# Patient Record
Sex: Female | Born: 1975 | Race: White | Hispanic: No | Marital: Married | Smoking: Never smoker
Health system: Southern US, Community
[De-identification: ages and names within clinical notes are randomized; demographics above are authoritative.]

## PROBLEM LIST (undated history)

## (undated) DIAGNOSIS — E069 Thyroiditis, unspecified: Secondary | ICD-10-CM

## (undated) DIAGNOSIS — Z9889 Other specified postprocedural states: Secondary | ICD-10-CM

## (undated) DIAGNOSIS — G43909 Migraine, unspecified, not intractable, without status migrainosus: Secondary | ICD-10-CM

## (undated) DIAGNOSIS — D649 Anemia, unspecified: Secondary | ICD-10-CM

## (undated) DIAGNOSIS — F419 Anxiety disorder, unspecified: Secondary | ICD-10-CM

## (undated) DIAGNOSIS — J45909 Unspecified asthma, uncomplicated: Secondary | ICD-10-CM

## (undated) DIAGNOSIS — R87619 Unspecified abnormal cytological findings in specimens from cervix uteri: Secondary | ICD-10-CM

## (undated) DIAGNOSIS — G47419 Narcolepsy without cataplexy: Secondary | ICD-10-CM

## (undated) DIAGNOSIS — B977 Papillomavirus as the cause of diseases classified elsewhere: Secondary | ICD-10-CM

## (undated) DIAGNOSIS — K589 Irritable bowel syndrome without diarrhea: Secondary | ICD-10-CM

## (undated) DIAGNOSIS — F319 Bipolar disorder, unspecified: Secondary | ICD-10-CM

## (undated) DIAGNOSIS — M1990 Unspecified osteoarthritis, unspecified site: Secondary | ICD-10-CM

## (undated) DIAGNOSIS — K219 Gastro-esophageal reflux disease without esophagitis: Secondary | ICD-10-CM

## (undated) HISTORY — DX: Gastro-esophageal reflux disease without esophagitis: K21.9

## (undated) HISTORY — DX: Papillomavirus as the cause of diseases classified elsewhere: B97.7

## (undated) HISTORY — DX: Anemia, unspecified: D64.9

## (undated) HISTORY — DX: Thyroiditis, unspecified: E06.9

## (undated) HISTORY — DX: Narcolepsy without cataplexy: G47.419

## (undated) HISTORY — DX: Irritable bowel syndrome, unspecified: K58.9

## (undated) HISTORY — DX: Unspecified asthma, uncomplicated: J45.909

## (undated) HISTORY — DX: Bipolar disorder, unspecified: F31.9

## (undated) HISTORY — DX: Unspecified abnormal cytological findings in specimens from cervix uteri: R87.619

## (undated) HISTORY — DX: Migraine, unspecified, not intractable, without status migrainosus: G43.909

## (undated) HISTORY — DX: Anxiety disorder, unspecified: F41.9

## (undated) HISTORY — PX: INGUINAL HERNIA REPAIR: SUR1180

## (undated) HISTORY — DX: Other specified postprocedural states: Z98.890

## (undated) HISTORY — PX: ABDOMINAL HYSTERECTOMY: SHX81

## (undated) HISTORY — PX: BUNIONECTOMY: SHX129

## (undated) HISTORY — DX: Unspecified osteoarthritis, unspecified site: M19.90

---

## 2004-05-14 ENCOUNTER — Other Ambulatory Visit (HOSPITAL_COMMUNITY): Admission: RE | Admit: 2004-05-14 | Discharge: 2004-05-18 | Payer: Self-pay | Admitting: Psychiatry

## 2004-05-20 ENCOUNTER — Emergency Department (HOSPITAL_COMMUNITY): Admission: EM | Admit: 2004-05-20 | Discharge: 2004-05-20 | Payer: Self-pay | Admitting: Emergency Medicine

## 2004-05-20 ENCOUNTER — Ambulatory Visit: Payer: Self-pay | Admitting: Psychiatry

## 2004-05-20 ENCOUNTER — Inpatient Hospital Stay (HOSPITAL_COMMUNITY): Admission: RE | Admit: 2004-05-20 | Discharge: 2004-05-24 | Payer: Self-pay | Admitting: Psychiatry

## 2004-05-25 ENCOUNTER — Other Ambulatory Visit (HOSPITAL_COMMUNITY): Admission: RE | Admit: 2004-05-25 | Discharge: 2004-08-23 | Payer: Self-pay | Admitting: Psychiatry

## 2004-09-08 ENCOUNTER — Inpatient Hospital Stay (HOSPITAL_COMMUNITY): Admission: AD | Admit: 2004-09-08 | Discharge: 2004-09-08 | Payer: Self-pay | Admitting: Obstetrics and Gynecology

## 2004-09-18 ENCOUNTER — Emergency Department (HOSPITAL_COMMUNITY): Admission: EM | Admit: 2004-09-18 | Discharge: 2004-09-19 | Payer: Self-pay | Admitting: Emergency Medicine

## 2004-10-26 ENCOUNTER — Other Ambulatory Visit: Admission: RE | Admit: 2004-10-26 | Discharge: 2004-10-26 | Payer: Self-pay | Admitting: Obstetrics and Gynecology

## 2005-02-02 ENCOUNTER — Inpatient Hospital Stay (HOSPITAL_COMMUNITY): Admission: AD | Admit: 2005-02-02 | Discharge: 2005-02-02 | Payer: Self-pay | Admitting: Gynecology

## 2005-03-31 ENCOUNTER — Encounter: Payer: Self-pay | Admitting: Family Medicine

## 2005-03-31 LAB — CONVERTED CEMR LAB

## 2005-04-19 ENCOUNTER — Inpatient Hospital Stay (HOSPITAL_COMMUNITY): Admission: AD | Admit: 2005-04-19 | Discharge: 2005-04-21 | Payer: Self-pay | Admitting: Obstetrics and Gynecology

## 2005-09-08 ENCOUNTER — Ambulatory Visit: Payer: Self-pay | Admitting: Endocrinology

## 2005-09-19 ENCOUNTER — Encounter (HOSPITAL_COMMUNITY): Admission: RE | Admit: 2005-09-19 | Discharge: 2005-11-22 | Payer: Self-pay | Admitting: Endocrinology

## 2005-10-19 ENCOUNTER — Ambulatory Visit: Payer: Self-pay | Admitting: Family Medicine

## 2005-11-01 ENCOUNTER — Ambulatory Visit: Payer: Self-pay | Admitting: Family Medicine

## 2005-11-18 ENCOUNTER — Ambulatory Visit: Payer: Self-pay | Admitting: Endocrinology

## 2006-01-03 ENCOUNTER — Ambulatory Visit: Payer: Self-pay | Admitting: Endocrinology

## 2006-01-31 ENCOUNTER — Ambulatory Visit: Payer: Self-pay | Admitting: Family Medicine

## 2006-02-01 ENCOUNTER — Ambulatory Visit: Payer: Self-pay | Admitting: Family Medicine

## 2006-03-01 ENCOUNTER — Ambulatory Visit: Payer: Self-pay | Admitting: Family Medicine

## 2006-03-01 DIAGNOSIS — D72819 Decreased white blood cell count, unspecified: Secondary | ICD-10-CM | POA: Insufficient documentation

## 2006-04-27 ENCOUNTER — Encounter: Payer: Self-pay | Admitting: Family Medicine

## 2006-04-27 DIAGNOSIS — E069 Thyroiditis, unspecified: Secondary | ICD-10-CM

## 2006-04-27 DIAGNOSIS — R87619 Unspecified abnormal cytological findings in specimens from cervix uteri: Secondary | ICD-10-CM

## 2006-04-27 DIAGNOSIS — F411 Generalized anxiety disorder: Secondary | ICD-10-CM | POA: Insufficient documentation

## 2006-04-27 DIAGNOSIS — K589 Irritable bowel syndrome without diarrhea: Secondary | ICD-10-CM

## 2006-04-27 DIAGNOSIS — F319 Bipolar disorder, unspecified: Secondary | ICD-10-CM

## 2006-04-27 DIAGNOSIS — B977 Papillomavirus as the cause of diseases classified elsewhere: Secondary | ICD-10-CM

## 2006-04-27 DIAGNOSIS — Z9189 Other specified personal risk factors, not elsewhere classified: Secondary | ICD-10-CM

## 2006-05-17 ENCOUNTER — Ambulatory Visit: Payer: Self-pay | Admitting: Endocrinology

## 2006-05-17 LAB — CONVERTED CEMR LAB
Basophils Relative: 0.4 % (ref 0.0–1.0)
Eosinophils Absolute: 0 10*3/uL (ref 0.0–0.6)
Hemoglobin: 13.5 g/dL (ref 12.0–15.0)
Lymphocytes Relative: 39 % (ref 12.0–46.0)
MCHC: 33.5 g/dL (ref 30.0–36.0)
MCV: 94.5 fL (ref 78.0–100.0)
Monocytes Relative: 4.6 % (ref 3.0–11.0)
Prolactin: 5.3 ng/mL
RBC: 4.27 M/uL (ref 3.87–5.11)
RDW: 12 % (ref 11.5–14.6)
TSH: 1.31 microintl units/mL (ref 0.35–5.50)

## 2006-05-29 ENCOUNTER — Ambulatory Visit: Payer: Self-pay | Admitting: Internal Medicine

## 2006-05-31 ENCOUNTER — Ambulatory Visit: Payer: Self-pay | Admitting: Family Medicine

## 2006-07-10 ENCOUNTER — Emergency Department (HOSPITAL_COMMUNITY): Admission: EM | Admit: 2006-07-10 | Discharge: 2006-07-10 | Payer: Self-pay | Admitting: *Deleted

## 2006-07-15 ENCOUNTER — Emergency Department (HOSPITAL_COMMUNITY): Admission: EM | Admit: 2006-07-15 | Discharge: 2006-07-15 | Payer: Self-pay | Admitting: Emergency Medicine

## 2006-07-20 ENCOUNTER — Telehealth: Payer: Self-pay | Admitting: Family Medicine

## 2006-08-16 ENCOUNTER — Ambulatory Visit: Payer: Self-pay | Admitting: Family Medicine

## 2006-08-16 DIAGNOSIS — S0990XA Unspecified injury of head, initial encounter: Secondary | ICD-10-CM | POA: Insufficient documentation

## 2006-12-01 ENCOUNTER — Ambulatory Visit: Payer: Self-pay | Admitting: Family Medicine

## 2006-12-01 DIAGNOSIS — L650 Telogen effluvium: Secondary | ICD-10-CM

## 2006-12-04 ENCOUNTER — Telehealth: Payer: Self-pay | Admitting: Family Medicine

## 2006-12-04 LAB — CONVERTED CEMR LAB: Iron: 70 ug/dL (ref 42–145)

## 2007-08-30 ENCOUNTER — Ambulatory Visit (HOSPITAL_COMMUNITY): Admission: RE | Admit: 2007-08-30 | Discharge: 2007-08-30 | Payer: Self-pay | Admitting: Obstetrics and Gynecology

## 2008-07-04 ENCOUNTER — Emergency Department (HOSPITAL_COMMUNITY): Admission: EM | Admit: 2008-07-04 | Discharge: 2008-07-04 | Payer: Self-pay | Admitting: Emergency Medicine

## 2008-10-25 ENCOUNTER — Emergency Department: Payer: Self-pay | Admitting: Emergency Medicine

## 2008-10-29 ENCOUNTER — Ambulatory Visit: Payer: Self-pay | Admitting: Family Medicine

## 2008-12-29 ENCOUNTER — Ambulatory Visit: Payer: Self-pay | Admitting: Family Medicine

## 2009-02-28 HISTORY — PX: BREAST ENHANCEMENT SURGERY: SHX7

## 2009-03-04 ENCOUNTER — Ambulatory Visit: Payer: Self-pay | Admitting: Family Medicine

## 2009-03-17 ENCOUNTER — Inpatient Hospital Stay: Payer: Self-pay | Admitting: Psychiatry

## 2009-04-20 ENCOUNTER — Ambulatory Visit: Payer: Self-pay | Admitting: Family Medicine

## 2009-04-20 DIAGNOSIS — R5381 Other malaise: Secondary | ICD-10-CM

## 2009-04-20 DIAGNOSIS — R5383 Other fatigue: Secondary | ICD-10-CM

## 2009-04-21 LAB — CONVERTED CEMR LAB
CO2: 31 meq/L (ref 19–32)
Calcium: 9.3 mg/dL (ref 8.4–10.5)
Chloride: 106 meq/L (ref 96–112)
GFR calc non Af Amer: 86.29 mL/min (ref >60)
Glucose, Bld: 87 mg/dL (ref 70–99)
Hemoglobin: 12.6 g/dL (ref 12.0–15.0)
Lymphocytes Relative: 25.3 % (ref 12.0–46.0)
Lymphs Abs: 1.7 10*3/uL (ref 0.7–4.0)
MCV: 98.4 fL (ref 78.0–100.0)
Monocytes Relative: 4.4 % (ref 3.0–12.0)
Neutro Abs: 4.6 10*3/uL (ref 1.4–7.7)
Potassium: 3.9 meq/L (ref 3.5–5.1)
RDW: 11.9 % (ref 11.5–14.6)
Sodium: 142 meq/L (ref 135–145)
TSH: 0.75 microintl units/mL (ref 0.35–5.50)

## 2009-06-10 ENCOUNTER — Encounter: Payer: Self-pay | Admitting: Family Medicine

## 2009-06-10 LAB — CONVERTED CEMR LAB
AST: 14 units/L
Alkaline Phosphatase: 49 units/L
Cholesterol: 157 mg/dL
HCT: 40.5 %
LDL Cholesterol: 93 mg/dL
RBC: 4.21 M/uL
Sodium: 139 meq/L
TSH: 1.05 microintl units/mL
Total Protein: 7.8 g/dL
Vit D, 25-Hydroxy: 29.3 ng/mL

## 2009-06-23 ENCOUNTER — Ambulatory Visit: Payer: Self-pay | Admitting: Family Medicine

## 2009-06-29 ENCOUNTER — Telehealth: Payer: Self-pay | Admitting: Family Medicine

## 2009-07-30 ENCOUNTER — Ambulatory Visit: Payer: Self-pay | Admitting: Family Medicine

## 2009-09-16 ENCOUNTER — Telehealth: Payer: Self-pay | Admitting: Family Medicine

## 2009-12-23 ENCOUNTER — Encounter: Payer: Self-pay | Admitting: Family Medicine

## 2009-12-23 LAB — CONVERTED CEMR LAB
AST: 14 units/L
Albumin: 4.4 g/dL
Alkaline Phosphatase: 49 units/L
BUN: 17 mg/dL
Chloride: 105 meq/L
Cholesterol: 168 mg/dL
Creatinine, Ser: 0.9 mg/dL
Glucose, Bld: 88 mg/dL
HDL: 60 mg/dL
LDL Cholesterol: 95 mg/dL
Potassium: 4.1 meq/L
RBC: 4.25 M/uL
TSH: 1.24 microintl units/mL
Triglycerides: 67 mg/dL
WBC: 6.68 10*3/uL

## 2010-01-11 ENCOUNTER — Ambulatory Visit: Payer: Self-pay | Admitting: Internal Medicine

## 2010-01-11 ENCOUNTER — Encounter: Payer: Self-pay | Admitting: Family Medicine

## 2010-01-11 IMAGING — RF DG HYSTEROGRAM
6 series · 6 of 6 positions shown · IV contrast (omnipaque)
Comparison: None

CLINICAL DATA: Post Essure procedure.  Evaluate tubes

HYSTEROSALPINGOGRAM
TECHNIQUE: Following cleansing of the cervix and vagina with
Betadine solution, a hysterosalpingogram was performed using a 5-
French hysterosalpingogram catheter and Omnipaque 300 contrast.

[Series 1: run · 1 of 1 slices shown (1 of 6)]
[im 1/1]
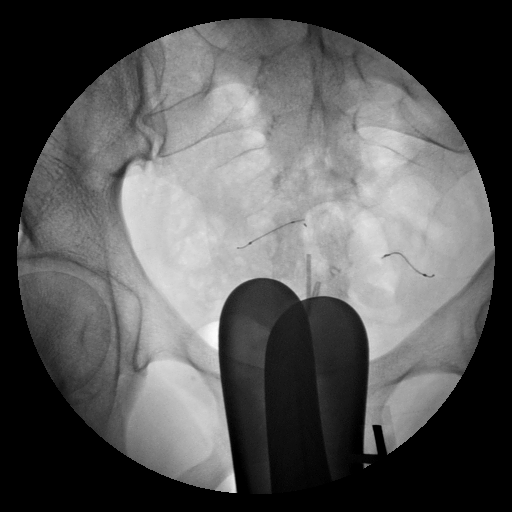

[Series 2: run · 1 of 1 slices shown (2 of 6)]
[im 1/1]
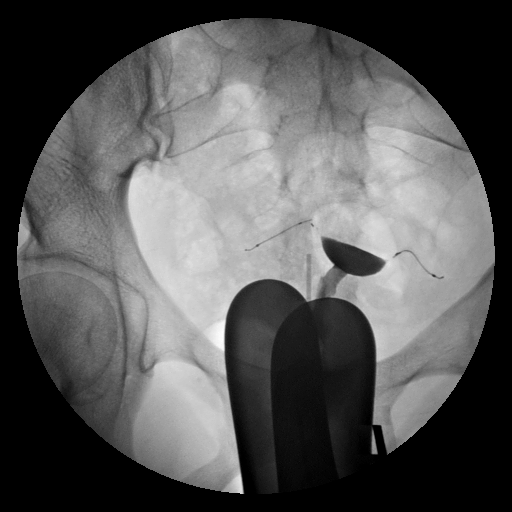

[Series 3: run · 1 of 1 slices shown (3 of 6)]
[im 1/1]
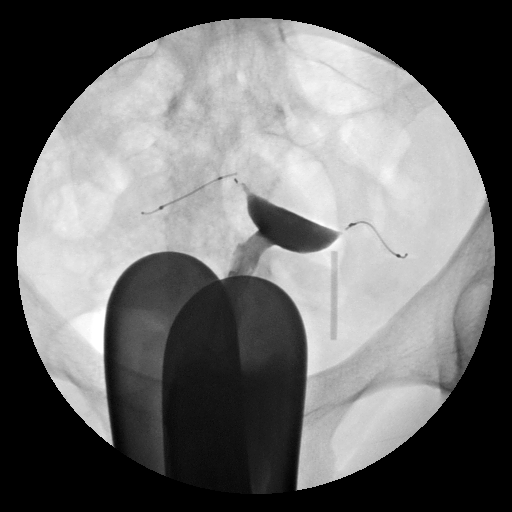

[Series 4: run · 1 of 1 slices shown (4 of 6)]
[im 1/1]
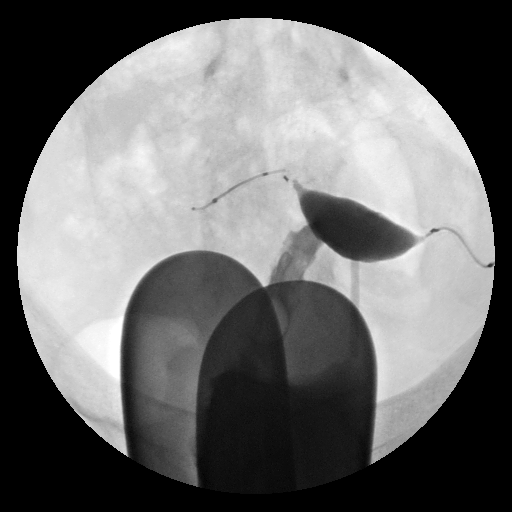

[Series 5: run · 1 of 1 slices shown (5 of 6)]
[im 1/1]
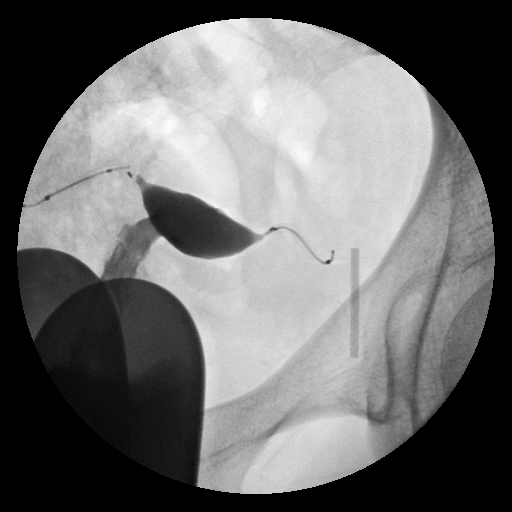

[Series 6: run · 1 of 1 slices shown (6 of 6)]
[im 1/1]
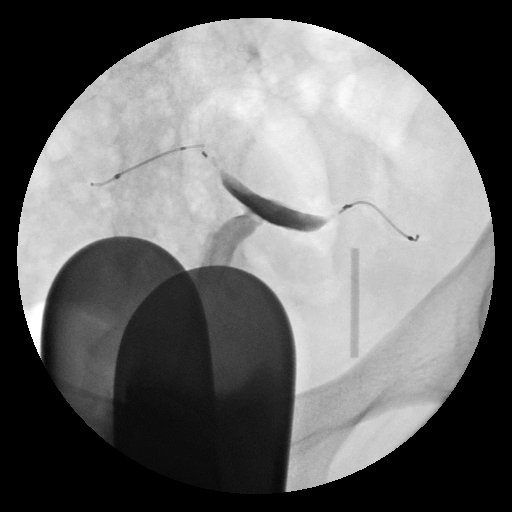

[6 of 6 positions shown; findings below may reference images not displayed]

FINDINGS: A preliminary film reveals the presence of Essure
implants within both aspects of the pelvis.  A normal endometrial
morphology is seen.  Both Essure implants lie within the proximal
aspect of the fallopian tube directly adjacent to the cornualtubal
junction.  Position is satisfactory bilaterally.  No evidence for
contrast is seen beyond the level of the implant suggesting
functional occlusion.
IMPRESSION: Appropriate implant position and findings compatible with
functional tubal occlusion bilaterally.

## 2010-01-12 ENCOUNTER — Encounter: Payer: Self-pay | Admitting: Family Medicine

## 2010-03-30 NOTE — Progress Notes (Signed)
Summary: pt feels tired  Phone Note Call from Patient   Caller: Patient Call For: Kerby Nora MD Reason for Call: Insurance Question Summary of Call: Pt states she has been feeling tired and has gained about 20 lbs in the past year.  Had recent labwork for thyroid and glucose and these came out ok.  She has appt with you on friday, I advised her to keep that appt. Initial call taken by: Lowella Petties CMA,  September 16, 2009 4:51 PM

## 2010-03-30 NOTE — Assessment & Plan Note (Signed)
Summary: LOW ENERGY/ SURGICAL CLEARANCE CYD   Vital Signs:  Patient profile:   35 year old female Height:      61.5 inches Weight:      131.2 pounds BMI:     24.48 Temp:     97.6 degrees F oral Pulse rate:   60 / minute Pulse rhythm:   regular BP sitting:   90 / 62  (left arm) Cuff size:   regular  Vitals Entered By: Benny Lennert CMA Duncan Dull) (April 20, 2009 11:07 AM)  History of Present Illness: Chief complaint Low energy and surgical clearence  35 year old female:  fatigue / preop labs Feels like she is crashing during the day nad having a hard time staying away.   Takes some klonopin. at night throughout the day. Loses the energy around two. 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   May 04, 2009 next psychiatry appointment, recently had a low cycle has had some heavy periods.    FAX TO Dr. Delia Chimes, FAX [phone].  Allergies: 1)  Codeine Phosphate (Codeine Phosphate)  Past History:  Past medical, surgical, family and social histories (including risk factors) reviewed, and no changes noted (except as noted below).  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  Social History: Reviewed history from 10/29/2008 and no changes required. Marital Status: seperated Children: 2 Occupation: massage therapist  Review of Systems General:  Complains of fatigue; denies chills and fever. GU:  See HPI. Psych:  See HPI; Complains of depression.  Physical Exam  Additional Exam:  GEN: WDWN, NAD, Non-toxic, A & O x 3 HEENT: Atraumatic, Normocephalic. Neck supple. No masses, No LAD. Ears and Nose: No external deformity. CV: RRR, No M/G/R. No JVD. No thrill. No  extra heart sounds. PULM: CTA B, no wheezes, crackles, rhonchi. No retractions. No resp. distress. No accessory muscle use. EXTR: No c/c/e NEURO: Normal gait.  PSYCH: Normally interactive. Conversant. somewhat flat affect     Impression & Recommendations:  Problem # 1:  FATIGUE (ICD-780.79)  Orders: Venipuncture (09811) TLB-BMP (Basic Metabolic Panel-BMET) (80048-METABOL) TLB-CBC Platelet - w/Differential (85025-CBCD) TLB-TSH (Thyroid Stimulating Hormone) (84443-TSH)  Problem # 2:  Hx of BIPOLAR AFFECTIVE DISORDER (ICD-296.80) #1 may all relate to this  Complete Medication List: 1)  Abilify 10 Mg Tabs (Aripiprazole) .... Take 1 tablet by mouth once a day 2)  Clonazepam 0.5 Mg Tabs (Clonazepam) .... As needed 3)  Lamictal Tabs (Lamotrigine tabs) .... Take 1 tablet by mouth once a day  Current Allergies (reviewed today): CODEINE PHOSPHATE (CODEINE PHOSPHATE)

## 2010-03-30 NOTE — Progress Notes (Signed)
Summary: flexeril isnt helping  Phone Note Call from Patient Call back at Home Phone [phone]   Caller: Patient Summary of Call: Pt was seen last week for neck and shoulder pain.  She was given flexeril but states this isint helping her at all.  She is taking 800 mg's of ibuprofen daily.  She needs something else for pain. [facility]. Initial call taken by: Lowella Petties CMA,  Jun 29, 2009 9:28 AM  Follow-up for Phone Call        d/c ibuprofen and flexeril  change meds   ok to f/u with me next week given symptoms, no improvement Follow-up by: Hannah Beat MD,  Jun 29, 2009 9:32 AM  Additional Follow-up for Phone Call Additional follow up Details #1::        patient advised.Consuello Masse CMA  Additional Follow-up by: Benny Lennert CMA Duncan Dull),  Jun 29, 2009 9:41 AM    New/Updated Medications: DICLOFENAC SODIUM 75 MG TBEC (DICLOFENAC SODIUM) 1 by mouth two times a day TIZANIDINE HCL 4 MG TABS (TIZANIDINE HCL) 1 by mouth at bedtime Prescriptions: TIZANIDINE HCL 4 MG TABS (TIZANIDINE HCL) 1 by mouth at bedtime  #30 x 1   Entered and Authorized by:   Hannah Beat MD   Signed by:   Hannah Beat MD on 06/29/2009   Method used:   [facility] [id number]       [address]       [place], [place]  [address]       Ph: [phone] or [phone]       Fax: [phone]   RxID:   [id number] DICLOFENAC SODIUM 75 MG TBEC (DICLOFENAC SODIUM) 1 by mouth two times a day  #60 x 1   Entered and Authorized by:   Hannah Beat MD   Signed by:   Hannah Beat MD on 06/29/2009   Method used:   [facility]        [facility] [facility]ret[facility]       [address]       [place], [place]  [address]       Ph: [phone] or [phone]       Fax: [phone]   RxID:   [id number]
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
## 2010-03-30 NOTE — Assessment & Plan Note (Signed)
Summary: SHOULDER PAIN WANTS TO GET IN ASAP/DLO   Vital Signs:  Patient profile:   35 year old female Weight:      133 pounds Temp:     98.6 degrees F oral Pulse rate:   80 / minute Pulse rhythm:   regular BP sitting:   98 / 64  (left arm) Cuff size:   regular  Vitals Entered By: Sydell Axon LPN (June 23, 2009 3:42 PM) CC: Neck pain and right shoulder pain X 5 days   History of Present Illness: Pt is massage therapist. She initially thought she had strained her right shoulder but now thinks she is having problems with her scalene muscle on the right sending pain down the right arm. She denies overuse, no trauma but woke up with this a morning about five days ago. 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 She tried bio freeze. She tried IBP. She took for 48hrs one a day.  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 Take 1 Tablet By Mouth Once A Day 2)  Clonazepam 0.5 Mg  Tabs (Clonazepam) .... As Needed 3)  Lamictal   Tabs (Lamotrigine Tabs) .... Take 1 Tablet By Mouth Once A Day  Allergies: 1)  Codeine Phosphate (Codeine Phosphate)  Physical Exam  Msk:  Pain in the right anterior supraclavicular fossa with radiationdown the right arm.   Impression & Recommendations:  Problem # 1:  CERVICAL MUSCLE STRAIN,  RIGHT SCALENE (ICD-847.0) Assessment New See instructions. Her updated medication list for this problem includes:    Flexeril 10 Mg Tabs (Cyclobenzaprine hcl) ..... One tab by mouth three times a day  Complete Medication List: 1)  Abilify 10 Mg Tabs (Aripiprazole) .... Take 1 tablet by mouth once a day 2)  Lamictal Tabs (Lamotrigine tabs) .... Take 1 tablet by mouth once a day 3)  Flexeril 10 Mg Tabs (Cyclobenzaprine hcl) .... One tab by mouth three times a day  Patient Instructions: 1)  IBP 800mg  after brfst , lunch and dinner. 2)  Flexeril three times a day if poss, at least at night. 3)  Heat and ice as discussed. 4)  Massage toward the end. 5)  RTC if sxs don't improve. Prescriptions: FLEXERIL 10 MG TABS (CYCLOBENZAPRINE HCL) one tab by mouth three times a day  #50 x 0   Entered and Authorized by:   Shaune Leeks MD   Signed by:   Shaune Leeks MD on 06/23/2009   Method used:   [facility]        [facility] [id number]       [address]       [place], [place]  [address]       Ph: [phone] or  [phone]       Fax: [phone]   RxID:   [id number]   Current Allergies (reviewed today): CODEINE PHOSPHATE (CODEINE PHOSPHATE)

## 2010-03-30 NOTE — Assessment & Plan Note (Signed)
Summary: ST/CLE   Vital Signs:  Patient profile:   35 year old female Height:      61.5 inches Weight:      131.8 pounds BMI:     24.59 Temp:     97.8 degrees F oral Pulse rate:   80 / minute Pulse rhythm:   regular BP sitting:   120 / 70  (left arm) Cuff size:   regular  Vitals Entered By: Benny Lennert CMA Duncan Dull) (July 30, 2009 8:28 AM)  History of Present Illness: Chief complaint sore throat  Acute Visit History: Sore Throat HPI   Complaints of sore throat starting:  History of (Y,N)  Fever: y, 101 max Cold or URI symptoms: n Dysphagia: y Dysphonia: n Drooling: n Headache: y Rash: n Swollend glands:    y   Location: neck Recent Strep Exposure: ?  Past medical History of Rheumatic Fever or Rheumatic Heart Disease (Y,N): n     Allergies: 1)  Codeine Phosphate (Codeine Phosphate)  Past History:  Past medical, surgical, family and social histories (including risk factors) reviewed, and no changes noted (except as noted below).  Past Medical History: Reviewed history from 04/20/2009 and no changes required. 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  Social History: Reviewed history from 10/29/2008 and no changes required. 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 CV: No chest pain or SOB GI: No noted N or V Otherwise, pertinent positives and negatives are noted in the HPI.   Physical Exam  General:  Well-developed,well-nourished,in no acute distress; alert,appropriate and cooperative throughout examination Head:  Normocephalic and atraumatic without obvious abnormalities.  No apparent alopecia or balding. Ears:  no external deformities.   Nose:  no external deformity.   Mouth:  Oral mucosa and oropharynx without lesions or exudates.  Teeth in good repair. Neck:  ttp ant lad Lungs:  Normal respiratory effort, chest expands symmetrically. Lungs are clear to auscultation, no crackles or wheezes. Heart:  Normal rate and regular rhythm. S1 and S2 normal without gallop, murmur, click, rub or other extra sounds. Psych:  Cognition and judgment appear intact. Alert and cooperative with normal attention span and concentration. No apparent delusions, illusions, hallucinations   Impression & Recommendations:  Problem # 1:  STREP THROAT (ICD-034.0)  Her updated medication list for this problem includes:    Diclofenac Sodium 75 Mg Tbec (Diclofenac sodium) .Marland Kitchen... 1 by mouth two times a day  Instructed to complete antibiotics and call if not improved in 48 hours.   Orders: Rapid Strep (16109) Bicillin CR 1.2 million units Injection (U0454) Admin of Therapeutic Inj  intramuscular or subcutaneous (09811)  Complete Medication List: 1)  Abilify 10 Mg Tabs (Aripiprazole) .... Take 1 tablet by mouth once a day 2)  Lamictal Tabs (Lamotrigine tabs) .... Take 1 tablet by mouth once a day 3)  Diclofenac Sodium 75 Mg Tbec (Diclofenac sodium) .Marland Kitchen.. 1 by mouth two times a day 4)  Tizanidine Hcl 4 Mg Tabs (Tizanidine hcl) .Marland Kitchen.. 1 by mouth at bedtime  Laboratory Results    Other Tests  Rapid Strep: positive  Kit Test Internal QC: Negative   (Normal Range: Negative)  Medication Administration  Injection # 1:    Medication: Bicillin CR 1.2 million units Injection    Diagnosis: STREP THROAT (ICD-034.0)    Route: IM    Site: RUOQ gluteus    Exp Date: 01/29/2012    Lot #: [id number]    Mfr: king    Patient tolerated injection without complications    Given by: Benny Lennert CMA Duncan Dull) (July 30, 2009 8:46 AM)  Orders Added: 1)  Est. Patient Level III [98119] 2)  Rapid  Strep [14782] 3)  Bicillin CR 1.2 million units Injection [J0558] 4)  Admin of Therapeutic Inj  intramuscular or subcutaneous [95621]

## 2010-03-30 NOTE — Miscellaneous (Signed)
   Clinical Lists Changes  Observations: Added new observation of VIT D 25-OH: 40.2 ng/mL (12/23/2009 11:03) Added new observation of PLATELETK/UL: 207 K/uL (12/23/2009 11:03) Added new observation of HCT: 39.2 % (12/23/2009 11:03) Added new observation of HGB: 13.5 g/dL (04/54/0981 19:14) Added new observation of RBC M/UL: 4.25 M/uL (12/23/2009 11:03) Added new observation of WBC COUNT: 6.68 10*3/microliter (12/23/2009 11:03) Added new observation of TSH: 1.24 microintl units/mL (12/23/2009 11:03) Added new observation of CALCIUM: 9.20 mg/dL (78/29/5621 30:86) Added new observation of ALBUMIN: 4.4 g/dL (57/84/6962 95:28) Added new observation of PROTEIN, TOT: 7.8 g/dL (41/32/4401 02:72) Added new observation of SGPT (ALT): 28 units/L (12/23/2009 11:03) Added new observation of SGOT (AST): 14 units/L (12/23/2009 11:03) Added new observation of ALK PHOS: 49 units/L (12/23/2009 11:03) Added new observation of BILI TOTAL: 1.1 mg/dL (53/66/4403 47:42) Added new observation of CREATININE: 0.9 mg/dL (59/56/3875 64:33) Added new observation of BUN: 17 mg/dL (29/51/8841 66:06) Added new observation of BG RANDOM: 88 mg/dL (30/16/0109 32:35) Added new observation of CO2 PLSM/SER: 27 meq/L (12/23/2009 11:03) Added new observation of CL SERUM: 105 meq/L (12/23/2009 11:03) Added new observation of K SERUM: 4.1 meq/L (12/23/2009 11:03) Added new observation of NA: 139 meq/L (12/23/2009 11:03) Added new observation of LDL: 95 mg/dL (57/32/2025 42:70) Added new observation of HDL: 60 mg/dL (62/37/6283 15:17) Added new observation of TRIGLYC TOT: 67 mg/dL (61/60/7371 06:26) Added new observation of CHOLESTEROL: 168 mg/dL (94/85/4627 03:50) Added new observation of VIT D 25-OH: 29.3 ng/mL (06/10/2009 11:03) Added new observation of PLATELETK/UL: 243 K/uL (06/10/2009 11:03) Added new observation of HCT: 40.5 % (06/10/2009 11:03) Added new observation of HGB: 13.1 g/dL (09/38/1829 93:71) Added new  observation of RBC M/UL: 4.21 M/uL (06/10/2009 11:03) Added new observation of WBC COUNT: 6.30 10*3/microliter (06/10/2009 11:03) Added new observation of TSH: 1.05 microintl units/mL (06/10/2009 11:03) Added new observation of CALCIUM: 9.50 mg/dL (69/67/8938 10:17) Added new observation of ALBUMIN: 4.4 g/dL (51/03/5850 77:82) Added new observation of PROTEIN, TOT: 7.8 g/dL (42/35/3614 43:15) Added new observation of SGPT (ALT): 28 units/L (06/10/2009 11:03) Added new observation of SGOT (AST): 14 units/L (06/10/2009 11:03) Added new observation of ALK PHOS: 49 units/L (06/10/2009 11:03) Added new observation of BILI TOTAL: 1.1 mg/dL (40/09/6759 95:09) Added new observation of CREATININE: 0.9 mg/dL (32/67/1245 80:99) Added new observation of BUN: 16 mg/dL (83/38/2505 39:76) Added new observation of BG RANDOM: 90 mg/dL (73/41/9379 02:40) Added new observation of CO2 PLSM/SER: 27 meq/L (06/10/2009 11:03) Added new observation of CL SERUM: 102 meq/L (06/10/2009 11:03) Added new observation of K SERUM: 4.0 meq/L (06/10/2009 11:03) Added new observation of NA: 139 meq/L (06/10/2009 11:03) Added new observation of LDL: 93 mg/dL (97/35/3299 24:26) Added new observation of HDL: 54 mg/dL (83/41/9622 29:79) Added new observation of TRIGLYC TOT: 48 mg/dL (89/21/1941 74:08) Added new observation of CHOLESTEROL: 157 mg/dL (14/48/1856 31:49)

## 2010-04-02 NOTE — Letter (Signed)
Summary: BP Log Brought by Patient  BP Log Brought by Patient   Imported By: Lanelle Bal 01/20/2010 15:36:21  _____________________________________________________________________  External Attachment:    Type:   Image     Comment:   External Document

## 2010-04-12 ENCOUNTER — Ambulatory Visit: Payer: Self-pay | Admitting: Family Medicine

## 2010-04-14 ENCOUNTER — Ambulatory Visit: Payer: Self-pay | Admitting: Family Medicine

## 2010-04-16 ENCOUNTER — Encounter: Payer: Self-pay | Admitting: Family Medicine

## 2010-04-16 ENCOUNTER — Ambulatory Visit (INDEPENDENT_AMBULATORY_CARE_PROVIDER_SITE_OTHER): Payer: PRIVATE HEALTH INSURANCE | Admitting: Family Medicine

## 2010-04-16 DIAGNOSIS — R5383 Other fatigue: Secondary | ICD-10-CM

## 2010-04-19 ENCOUNTER — Encounter (INDEPENDENT_AMBULATORY_CARE_PROVIDER_SITE_OTHER): Payer: Self-pay | Admitting: *Deleted

## 2010-04-19 ENCOUNTER — Other Ambulatory Visit: Payer: Self-pay | Admitting: Family Medicine

## 2010-04-19 ENCOUNTER — Other Ambulatory Visit (INDEPENDENT_AMBULATORY_CARE_PROVIDER_SITE_OTHER): Payer: PRIVATE HEALTH INSURANCE

## 2010-04-19 DIAGNOSIS — R5381 Other malaise: Secondary | ICD-10-CM

## 2010-04-19 DIAGNOSIS — R5383 Other fatigue: Secondary | ICD-10-CM

## 2010-04-19 LAB — LUTEINIZING HORMONE: LH: 2.65 m[IU]/mL

## 2010-04-19 LAB — FOLLICLE STIMULATING HORMONE: FSH: 3 m[IU]/mL

## 2010-04-21 ENCOUNTER — Other Ambulatory Visit: Payer: Self-pay | Admitting: Family Medicine

## 2010-04-21 DIAGNOSIS — R5383 Other fatigue: Secondary | ICD-10-CM

## 2010-04-21 DIAGNOSIS — R5381 Other malaise: Secondary | ICD-10-CM

## 2010-04-21 LAB — CORTISOL: Cortisol, Plasma: 18.9 ug/dL
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
## 2010-04-27 ENCOUNTER — Telehealth: Payer: Self-pay | Admitting: Family Medicine

## 2010-05-06 NOTE — Progress Notes (Signed)
Summary: thinks she has over growth of yeast in intestines  Phone Note Call from Patient Call back at Home Phone [phone]   Caller: Patient Call For: Kerby Nora MD Summary of Call: Pt believes she has a yeast over growth in her intestines- she said that would explain all of her symptoms.  She is asking for nystatin or diflucan to get rid of this.  She says she will need quite a bit.  Uses [facility]. Initial call taken by: Lowella Petties CMA, AAMA,  April 27, 2010 9:31 AM  Follow-up for Phone Call        I do not think treating her symtpoms with antifungals is appropraite. She can start OTC lactobaccili to help with regulating /normalizing intestinal flora.  if GI issues continue.. we can refer to GI for her to discuss her yeast concerns. Follow-up by: Kerby Nora MD,  April 27, 2010 10:26 AM  Additional Follow-up for Phone Call Additional follow up Details #1::        Patient advised.Consuello Masse CMA   Additional Follow-up by: Benny Lennert CMA Duncan Dull),  April 27, 2010 11:18 AM
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
## 2010-07-16 NOTE — Discharge Summary (Signed)
NAMEMarland Barron  [name], [name]             ACCOUNT NO.:  [id number]   MEDICAL RECORD NO.:  [id number]          PATIENT TYPE:  INP   LOCATION:  9135                          FACILITY:  [facility]   PHYSICIAN:  Zenaida Niece, M.D.DATE OF BIRTH:  1975-07-05   DATE OF ADMISSION:  04/19/2005  DATE OF DISCHARGE:                                 DISCHARGE SUMMARY   ADMISSION DIAGNOSIS:  Intrauterine pregnancy at 39 weeks.   DISCHARGE DIAGNOSIS:  Intrauterine pregnancy at 39 weeks.   PROCEDURES:  On April 19, 2005, she had a spontaneous vaginal delivery.   HISTORY AND PHYSICAL:  This is a 35 year old white female gravida 2, para 1-  0-0-1 with an EGA of [redacted] weeks who presents for elective induction. Prenatal  care essentially uncomplicated except for recent pressure and general  discomfort. Prenatal labs:  Blood type is O negative with a negative  antibody screen, RPR nonreactive, rubella immune, hepatitis B surface  antigen negative, HIV negative, gonorrhea and chlamydia negative, triple  screen normal, 1-hour Glucola 78, and group B strep is negative. Past OB  history:  In 2004 a vaginal delivery at 38 weeks, 6 pounds 15 ounces, no  complications. GYN history:  History of chlamydia, HPV, and an abnormal Pap  smear. Past medical history:  Irritable bowel syndrome, anxiety, depression,  scarlet fever, and pneumonia. Past surgical history:  Bunionectomy.  Allergies to CODEINE. Physical exam:  She is afebrile with stable vital  signs. Fetal heart tracing is reactive with irregular contractions. Abdomen  gravid, nontender, with an estimated fetal weight of 6-and-a-half pounds.  Cervix is 1-2, 50, -1 to -2, with a vertex presentation and an adequate  pelvis.   HOSPITAL COURSE:  The patient was admitted and started on Pitocin. On my  first exam, I attempted amniotomy but was not able to obtain fluid. She was  continued on Pitocin. She did have rupture of membranes at 1530 which was  probably  spontaneous. She then received an epidural. She progressed fairly  quickly to complete, pushed well, and on the evening of February 20 had a  vaginal delivery of a viable female infant with Apgars of 9 and 9 that weighed  6 pounds 7 ounces. Placenta delivered spontaneous, was intact, and was sent  for cord blood collection. She had two small labial abrasions which were  hemostatic and not repaired and estimated blood loss was less than 500 mL.  Postpartum, she had no significant complications and on postpartum #2 was  stable for discharge home.   DISCHARGE INSTRUCTIONS:  Regular diet, pelvic rest, follow-up in 6 weeks.  Medications are over-the-counter ibuprofen as needed, and she is given our  discharge pamphlet.      Zenaida Niece, M.D.  Electronically Signed     TDM/MEDQ  D:  04/21/2005  T:  04/21/2005  Job:  [id number]
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
## 2010-07-16 NOTE — Consult Note (Signed)
[facility]                            ENDOCRINOLOGY CONSULTATION   Susan Barron, Susan Barron                      MRN:          [id number]  DATE:01/03/2006                            DOB:          30-Mar-1975    REASON FOR VISIT:  Follow up thyroid.   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  She is feeling well except for her  depression.   SOCIAL HISTORY:  She is now 8 months postpartum.   REVIEW OF SYSTEMS:  She has lost some weight since the birth of her child,  but she is not yet back to her prepregnancy weight.   PHYSICAL EXAMINATION:  VITAL SIGNS:  Blood pressure 113/70, heart rate is  70, temperature 98.0, weight 126.  GENERAL:  No distress.  NECK:  Thyroid is normal.  NEUROLOGIC:  Alert, well-oriented.  Does not appear anxious.  No depression.  There is no tremor.   Laboratory studies on January 03, 2006, TSH 1.00.   IMPRESSION:  Hyperthyroidism has resolved, and she has not gone into a  hypothyroid phase.   PLAN:  1. No thyroid medication needed now.  2. Return in about 4 months.    ______________________________  Cleophas Dunker. Everardo All, MD    SAE/MedQ  DD: 01/04/2006  DT: 01/05/2006  Job #: [id number]   cc:   [address], [place], [place] [address] Fabio Pierce, CPP

## 2010-07-16 NOTE — Assessment & Plan Note (Signed)
[facility]                             [facility] OFFICE NOTE   Susan, Barron                      MRN:          [id number]  DATE:10/19/2005                            DOB:          09/08/1975   CHIEF COMPLAINT:  A 35 year old female here to establish new doctor as well  as to discuss anxiety.   HISTORY OF PRESENT ILLNESS:  Susan Barron states that she has a history of  bipolar disorder.  She has been seen previously by Dr. Tomasa Rand as well as  a psychologist, Dr. Orion Modest in [facility].  She states that she saw them  last approximately one year ago. She has a history of several  hospitalizations for bipolar disorder, most recently in 2006.  Over the past  six months she has been experiencing an increase in anxiety.  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  She has also  been diagnosed with post partum thyroiditis and has a TSH level to be  rechecked at the end of August by Dr. Everardo All, her endocrinologist.  She  states that her anxiety occurs in somewhat discrete episodes when she is  rushing around and feels that she cannot handle everything that is on her  plate.  She tries to take deep breaths and relax and manages her stress  through prayer and meditation.  She denies any sadness or anhedonia.  She  gets a lot of her support from God.  She denies any recent manic episodes  but her husband did say she was acting keyed-up.  She reports some  insomnia with difficulty falling and staying asleep but she does have to  wake up frequently throughout the night to take care of her 24 month old.  In the past she has been treated with lithium and Lamictal with significant  side effects that limited their use.  She is questioning [place] to whether she  needs to be on a  different medication for anxiety or bipolar.   PAST MEDICAL HISTORY:  1. Bipolar disorder.  2. Anxiety.  3. Post partum thyroiditis.  4. Irritable bowel syndrome.  5. Abnormal pap with HPV.   HOSPITALIZATIONS, SURGERIES, PROCEDURES:  1. Three hospitalizations for bipolar disorder, two in the 1990s and the      most recently in 2006.  2. Bunionectomy.  3. 2005 colposcopy negative.  4. Pap smear February 2007, positive for HPV.  Repeating pap smears at Dr.      Eligha Bridegroom office q. 6 months.   ALLERGIES:  CODEINE causes itching.   MEDICATIONS:  Multivitamin daily.   REVIEW OF SYSTEMS:  No headache, no dizziness, no syncope, no hearing loss,  no glasses, no shortness of breath, no palpitations.  No vomiting, diarrhea,  constipation,  or rectal bleeding.   FAMILY HISTORY:  She is adopted so is unsure of her family history.  She  knows she  has three sisters but does not know whether they are healthy.   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  She works as a Advertising copywriter three  days a week and the other two days a week she takes care of her children.  She has been married for approximately one year.  She has two children who  are healthy, ages 64 or 4 and 68-months.  She gets regular exercise by walking  three or four times per week.  Her nutrition consists mainly of  carbohydrates and she skips meals frequently.  She loves granola bars.  She  gets limited amount of vegetables and fruit but drinks a lot of water.   PHYSICAL EXAMINATION:  VITAL SIGNS:  Height 5 feet 2 inches. Weight 132  making BMI 24.  Blood pressure 102/70.  Pulse 80.  Temperature 98.1.  GENERAL:  Healthy appearing female in no apparent distress.  PSYCH:  Appropriate affect, good insight, well kept appearance, appropriate  responses to children, no suicidal or  homicidal ideation.  PULMONARY:  Clear to auscultation bilaterally.  No wheezes, rales or  rhonchi.  No cyanosis.  CARDIOVASCULAR:  Regular rate and rhythm.  No murmurs, rubs or gallops.  Normal PMI.  NEURO:  Alert and oriented x3.  Cranial nerves II -XII grossly intact.  MUSCULOSKELETAL:  Strength 5/5 in upper and lower extremities.  Range of  motion within normal limits.  Gait non-antalgic.   ASSESSMENT/PLAN:  1. Anxiety:  Her anxiety may be secondary to a combination of factors      including the high level of stress and difficulty adjusting as well as      her symptoms of thyroiditis.  Given her history of bipolar disorder, I      feel she needs to return to see Dr. Tomasa Rand to consider a mood      stabilizer.  I do not believe she is having manic episode right now but      might be experiencing some hypomania.  We discussed considering a      temporary prescription for a benzodiazepine but she agreed with me that      relaxation techniques most likely are more effective.  I would like her      to begin seeing Dr. Luiz Blare at our office to discuss adjustment to the      good and bad stress in her life as well as to learn more relaxation      techniques.  She would prefer not to return to [facility] to see Dr.      Orion Modest because it is too far away for her.  2. Bipolar disorder:  We will re-refer her to Dr. Tomasa Rand for      consideration of mood stabilizer.  3. Post partum thyroiditis:  She will return to see Dr. Everardo All for this      and will have a thyroid      stimulating hormone checked.  4. She will return to clinic in one to two months to followup on these      issues.                                   Kerby Nora, MD  AB/MedQ  DD:  10/19/2005 DT:  10/19/2005 Job #:  [id number]   cc:   Zenaida Niece, MD

## 2010-07-16 NOTE — Consult Note (Signed)
[facility]                          ENDOCRINOLOGY CONSULTATION   Susan Barron, Susan Barron                      MRN:          [id number]  DATE:05/17/2006                            DOB:          04/14/1975    REASON FOR VISIT:  Follow up thyroid.   HISTORY OF PRESENT ILLNESS:  A 35 year old woman who has a history last  year of mild hyperthyroidism, but this has resolved.   She states she is suffering from some fatigue and depression recently.   Also, she states she has some decreased libido and wants to have her  prolactin checked because she hears that an elevated prolactin can cause  decreased libido.   PAST MEDICAL HISTORY:  1. Bipolar disorder.  2. Her husband has had a vasectomy.  3. She is not breast feeding.   REVIEW OF SYSTEMS:  Denies galactorrhea.  She has lost 8 more pounds  since the birth of her child last year.   PHYSICAL EXAMINATION:  VITAL SIGNS:  Blood pressure 96/60, heart rate  74, temperature 98.2, weight is 118.  GENERAL:  A healthy-appearing young woman in no distress.  NECK:  Thyroid is normal.  SKIN:  Not diaphoretic.  NEUROLOGIC:  No tremor.   LABORATORY STUDIES:  On May 17, 2006, CBC, prolactin and TSH were all  normal.   IMPRESSION:  1. Recent history of mild hyperthyroidism, spontaneously resolved.  2. Decreased libido with a normal prolactin.  3. Mild symptoms of depression and fatigue.   PLAN:  1. As no endocrine cause is found, she is advised to follow up with      Fabio Pierce and with Dr. Jackelyn Knife.  2. She should have a TSH checked in a year, as well as a physical      examination and thyroid in a year.  She could do this hear or one      of her other doctors.     Sean A. Everardo All, MD  Electronically Signed    SAE/MedQ  DD: 05/18/2006  DT: 05/18/2006  Job #: [id number]   cc:   Kerby Nora, MD  Zenaida Niece, M.D.  Fabio Pierce, C.P.P.
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
## 2010-08-24 ENCOUNTER — Other Ambulatory Visit: Payer: Self-pay | Admitting: *Deleted

## 2010-08-24 ENCOUNTER — Encounter: Payer: Self-pay | Admitting: Family Medicine

## 2010-08-24 DIAGNOSIS — R5383 Other fatigue: Secondary | ICD-10-CM

## 2010-08-24 NOTE — Telephone Encounter (Signed)
Pt would like to have thyroid testing done.  She says she doesn't feel well at all- tired all the time and can hardly stay awake.  She says she knows something is wrong with her thyroid and wants extensive testing done.

## 2010-08-25 ENCOUNTER — Ambulatory Visit: Payer: PRIVATE HEALTH INSURANCE | Admitting: Family Medicine

## 2010-08-25 ENCOUNTER — Other Ambulatory Visit (INDEPENDENT_AMBULATORY_CARE_PROVIDER_SITE_OTHER): Payer: PRIVATE HEALTH INSURANCE

## 2010-08-25 DIAGNOSIS — R5383 Other fatigue: Secondary | ICD-10-CM

## 2010-08-25 DIAGNOSIS — R5381 Other malaise: Secondary | ICD-10-CM

## 2010-08-25 NOTE — Telephone Encounter (Signed)
Patient notified via triage

## 2010-08-26 ENCOUNTER — Telehealth: Payer: Self-pay | Admitting: *Deleted

## 2010-08-26 DIAGNOSIS — R5383 Other fatigue: Secondary | ICD-10-CM

## 2010-08-26 LAB — TSH: TSH: 0.96 u[IU]/mL (ref 0.35–5.50)

## 2010-08-26 NOTE — Telephone Encounter (Signed)
Patient would like for you to look at her blood work when it come back and give her the results if possible. She would really like results today. But, blood work not back yet.

## 2010-08-26 NOTE — Telephone Encounter (Signed)
Pt states her thyroid levels are still showing normal but she says there is something wrong with her- she falls asleep all the time.  She wants to see a specialist, whoever she needs to see.  She prefers to go to [facility], but will go wherever is necessary.

## 2010-08-26 NOTE — Telephone Encounter (Signed)
PATIENT ADVISED

## 2010-08-26 NOTE — Telephone Encounter (Signed)
I would say that the next step is a sleep study. I would refer her to a sleep specialist/pulmonologist for this if she is agreeable.

## 2010-08-26 NOTE — Telephone Encounter (Signed)
Call --- I want you to call and speak to this patient directly, not a message and not on phone tree  Thyroid and blood tests normal

## 2010-08-27 NOTE — Telephone Encounter (Signed)
Left message for patient to return my call.

## 2010-08-27 NOTE — Telephone Encounter (Signed)
Agreed -

## 2010-08-27 NOTE — Telephone Encounter (Signed)
Spoke with Aram Beecham and patient does not want to go for a sleep study she says that she sleeps to much so sleep is not her problem. I advised cynthia that patient would just need to come in for appt then.

## 2010-08-30 NOTE — Telephone Encounter (Signed)
Pt has agreed to schedule appointment w/ a Sleep Spec, the appointment has been scheduled ...cdavis 08-30-2010

## 2010-09-28 ENCOUNTER — Encounter: Payer: Self-pay | Admitting: Internal Medicine

## 2010-09-28 ENCOUNTER — Ambulatory Visit (INDEPENDENT_AMBULATORY_CARE_PROVIDER_SITE_OTHER): Payer: PRIVATE HEALTH INSURANCE | Admitting: Internal Medicine

## 2010-09-28 VITALS — BP 114/60 | HR 63 | Ht 62.0 in | Wt 137.6 lb

## 2010-09-28 DIAGNOSIS — G4710 Hypersomnia, unspecified: Secondary | ICD-10-CM | POA: Insufficient documentation

## 2010-09-28 DIAGNOSIS — G47419 Narcolepsy without cataplexy: Secondary | ICD-10-CM

## 2010-09-28 NOTE — Patient Instructions (Signed)
[facility]  - for dx hypersomnia with sleep apnea,  followed immediately next day by MSLT  - for dx narcolepsy  May continue Abilify through the studies

## 2010-09-28 NOTE — Assessment & Plan Note (Addendum)
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 Doubt seizure. Documentation will be important going forward, anticipating long term use of stimulant mediciations. We have discussed and will schedule NPSG to exclude unrecognized sleep disturbance, followed by MSLT to objectively assess for daytime somnolence after known sleep experience. . We reviewed sleep habits and good sleep hygiene with no significant issues identified.
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
## 2010-10-12 ENCOUNTER — Ambulatory Visit (HOSPITAL_BASED_OUTPATIENT_CLINIC_OR_DEPARTMENT_OTHER): Payer: No Typology Code available for payment source | Attending: Internal Medicine

## 2010-10-12 DIAGNOSIS — R0989 Other specified symptoms and signs involving the circulatory and respiratory systems: Secondary | ICD-10-CM | POA: Insufficient documentation

## 2010-10-12 DIAGNOSIS — G4730 Sleep apnea, unspecified: Secondary | ICD-10-CM | POA: Insufficient documentation

## 2010-10-12 DIAGNOSIS — R0609 Other forms of dyspnea: Secondary | ICD-10-CM | POA: Insufficient documentation

## 2010-10-12 DIAGNOSIS — G4710 Hypersomnia, unspecified: Secondary | ICD-10-CM | POA: Insufficient documentation

## 2010-10-13 ENCOUNTER — Ambulatory Visit (HOSPITAL_BASED_OUTPATIENT_CLINIC_OR_DEPARTMENT_OTHER): Payer: PRIVATE HEALTH INSURANCE | Attending: Internal Medicine

## 2010-10-17 DIAGNOSIS — G4730 Sleep apnea, unspecified: Secondary | ICD-10-CM

## 2010-10-17 DIAGNOSIS — G4710 Hypersomnia, unspecified: Secondary | ICD-10-CM

## 2010-10-17 DIAGNOSIS — R0609 Other forms of dyspnea: Secondary | ICD-10-CM

## 2010-10-17 DIAGNOSIS — R0989 Other specified symptoms and signs involving the circulatory and respiratory systems: Secondary | ICD-10-CM

## 2010-10-17 NOTE — Procedures (Signed)
NAME:  Susan Barron, Susan Barron NO.:  [id number]  MEDICAL RECORD NO.:  [id number]          PATIENT TYPE:  OUT  LOCATION:  SLEEP CENTER                 FACILITY:  [facility]  PHYSICIAN:  Bertice Risse D. Maple Hudson, MD, FCCP, FACPDATE OF BIRTH:  15-Dec-1975  DATE OF STUDY:  10/12/2010                         MULTIPLE SLEEP LATENCY TEST  REFERRING PHYSICIAN:  Teddi Badalamenti D. Maple Hudson, MD, FCCP, FACP  MEAN SLEEP LATENCY:  </MEAN SLEEP LATENCY/>  NUMBER OF REM EPISODES:  </NUMBER OF REM EPISODES/>  COMMENTS:  </COMMENTS/>  IMPRESSIONS-RECOMMENDATIONS:  </IMPRESSIONS-RECOMMENDATIONS/> This is a multiple sleep latency test report.  REFERRING PHYSICIAN:  Asbury Hair D. 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  EPWORTH SLEEPINESS SCORE:  Epworth sleepiness score 24/24.  Weight 135 pounds, height 62 inches.  Neck 12 inches.  BMI:  24.7.  MEDICATIONS:  Home medications charted and reviewed.  A diagnostic nocturnal polysomnogram was done on the night preceding this study, recording 444 minutes of total sleep time of which REM was 33.3% with insignificant respiratory and movement disturbance.  AHI 1.2 per hour.  Recent Medications:  Abilify,  vitamin D.  No medications were taken on the night before or during the study.  NAP 1:  8 a.m., sleep latency 1 minute, REM latency 3.5 minutes.  NAP 2:  10 a.m., sleep latency 0.5 minutes.  NAP 3:  12 noon, sleep latency 1 minute.  NAP 4:  2 p.m., sleep latency 1 minute.  NAP 5:  4 p.m., sleep latency 0, REM latency 3 minutes.  The patient reported sleep paralysis associated with naps #1 and #5.   MEAN SLEEP LATENCY:  0.7 minutes  NUMBER OF REM EPISODES:  2  COMMENTS:  IMPRESSIONS-RECOMMENDATIONS: 1. Mean sleep latency 0.7 minutes, which indicates severe daytime     hypersomnolence in the pathologic range in association with REM     sleep on 2 of the naps; the pattern is very strongly consistent   with narcolepsy. 2. Nocturnal polysomnogram on the preceding night is supportive of     this conclusion with sustained sleep and a high percentage time     spent in REM, without medication.     Ariston Grandison D. Maple Hudson, MD, [facility], FACP Diplomate, Biomedical engineer of Sleep Medicine Electronically Signed    CDY/MEDQ  D:  10/17/2010 15:16:37  T:  10/17/2010 21:01:33  Job:  [id number]

## 2010-10-17 NOTE — Procedures (Signed)
NAME:  Susan Barron, Susan Barron NO.:  [id number]  MEDICAL RECORD NO.:  [id number]          PATIENT TYPE:  OUT  LOCATION:  SLEEP CENTER                 FACILITY:  [facility]  PHYSICIAN:  Braedyn Riggle D. Maple Hudson, MD, FCCP, FACPDATE OF BIRTH:  1974/01/23  DATE OF STUDY:  10/12/2010                           NOCTURNAL POLYSOMNOGRAM  REFERRING PHYSICIAN:  Pal Shell D. Maple Hudson, MD, FCCP, FACP  REFERRING PHYSICIAN:  Ashvin Adelson D. Caydence Koenig, MD, FCCP, FACP  INDICATION FOR STUDY:  Hypersomnia with sleep apnea.  EPWORTH SLEEPINESS SCORE:  24/24.  BMI 24.7.  Weight 135 pounds, height 62 inches.  Neck 12 inches.  MEDICATIONS:  Home medications are charted and reviewed.  SLEEP ARCHITECTURE:  Total sleep time 444 minutes with sleep efficiency of 98.4%.  Stage I was 1.6%, stage II 60.7%, stage III 4.4%, REM 33.3% of total sleep time.  Sleep latency 1 minute, REM latency 43 minutes, awake after sleep onset 6 minutes, arousal index 9.9.  Bedtime Medication:  None.  RESPIRATORY DATA:  Apnea/hypopnea index (AHI) 1.2 per hour.  A total of 9 events was scored including 2 obstructive apneas, 3 central apneas, 4 hypopneas.  Most events were associated with supine sleep.  REM/AHI 0. RDI 2.6 per hour.  This was diagnostic NPSG.  OXYGEN DATA:  Mild snoring with oxygen desaturation to a nadir of 90% and mean oxygen saturation through the study of 96.3% on room air.  CARDIAC DATA:  Normal sinus rhythm.  MOVEMENT-PARASOMNIA:  No significant movement disturbance.  No bathroom trips.  IMPRESSIONS-RECOMMENDATIONS: 1. Significant daytime hypersomnia indicated by Epworth score of     24/24.  Increased percentage time spent in REM. 2. Occasional respiratory event with sleep disturbance, within normal     limits, AHI 1.2 per hour (normal adult ranges between 0 and 5 per     hour).  Mild snoring with oxygen desaturation to a nadir of 90% and     a mean     oxygen saturation through the study of 96.3% on room air. 3.  See report of multiple sleep latency test done to follow the study.     Basma Buchner D. Maple Hudson, MD, [facility], FACP Diplomate, Biomedical engineer of Sleep Medicine Electronically Signed    CDY/MEDQ  D:  10/17/2010 15:03:35  T:  10/17/2010 20:55:13  Job:  [id number]

## 2010-10-18 ENCOUNTER — Telehealth: Payer: Self-pay | Admitting: Internal Medicine

## 2010-10-18 DIAGNOSIS — R0989 Other specified symptoms and signs involving the circulatory and respiratory systems: Secondary | ICD-10-CM

## 2010-10-18 DIAGNOSIS — G4710 Hypersomnia, unspecified: Secondary | ICD-10-CM

## 2010-10-18 DIAGNOSIS — R0609 Other forms of dyspnea: Secondary | ICD-10-CM

## 2010-10-18 DIAGNOSIS — G4730 Sleep apnea, unspecified: Secondary | ICD-10-CM

## 2010-10-18 NOTE — Telephone Encounter (Signed)
PT RETURNED CALL FROM DR. YOUNG. SHE DIDN'T STAY ON THE PHONE LONG ENOUGH FOR ME TO GET [name]. I HAVE SHCHEDULED HER FOR AN OV THIS WED AT 10 AND WILL TRY TO REACH HER IN THE MEANTIME. Susan Barron

## 2010-10-18 NOTE — Telephone Encounter (Signed)
[name], I LMOM FOR PT RE: APPT THIS WED AT 10:00.

## 2010-10-18 NOTE — Telephone Encounter (Signed)
I returned call. LMOM inviting call back.  Her sleep study does show a condition usually treated with stimulant meds. If we have an opening, I can see her back now any time instead of waiting for the September 17 appointment.

## 2010-10-18 NOTE — Telephone Encounter (Signed)
Pt is calling for results of sleep study that was done on Tues., 8/14. She said there was also a day study? Done the next day on Wed., 8/15. Dr. Maple Hudson, have you had a chance to read this study? Pls advise.

## 2010-10-19 ENCOUNTER — Ambulatory Visit (INDEPENDENT_AMBULATORY_CARE_PROVIDER_SITE_OTHER): Payer: PRIVATE HEALTH INSURANCE | Admitting: Family Medicine

## 2010-10-19 ENCOUNTER — Encounter: Payer: Self-pay | Admitting: Family Medicine

## 2010-10-19 VITALS — BP 92/60 | HR 80 | Temp 97.7°F | Wt 135.2 lb

## 2010-10-19 DIAGNOSIS — R300 Dysuria: Secondary | ICD-10-CM

## 2010-10-19 LAB — POCT URINALYSIS DIPSTICK
Bilirubin, UA: NEGATIVE
Ketones, UA: NEGATIVE
pH, UA: 5

## 2010-10-19 NOTE — Telephone Encounter (Signed)
Pt aware of appt. Jennifer Castillo, CMA  

## 2010-10-19 NOTE — Progress Notes (Signed)
SUBJECTIVE: Susan Barron is a 35 y.o. female here for ? UTI. Was urinating yesterday and sneezed while urinating.  Felt some dysuria after she sneezed and little bit of blood. Had some persistent dysuria for a few hours after that but since then all symptoms have resolved. Denies any flank pain, fever, chills, or abnormal vaginal discharge or bleeding.   No known h/o nephrolithiasis.  Patient Active Problem List  Diagnoses  . HPV  . THYROIDITIS  . LEUKOCYTOPENIA NOS  . BIPOLAR AFFECTIVE DISORDER  . ANXIETY  . IBS  . TELOGEN EFFLUVIUM  . FATIGUE  . ABNORMAL PAP SMEAR  . HEAD TRAUMA  . Other Specified Personal History Presenting Hazards to Health  . Hypersomnia, organic   Past Medical History  Diagnosis Date  . Anxiety   . Abnormal glandular Papanicolaou smear of cervix   . Status post bunionectomy   . Thyroiditis, unspecified   . Bipolar disorder, unspecified   . History of colposcopy with cervical biopsy   . 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 Irritable bowel syndrome    No past surgical history on file. History  Substance Use Topics  . Smoking status: Never Smoker   . Smokeless tobacco: Not on file  . Alcohol Use: Not on file   No family history on file. Allergies  Allergen Reactions  . Codeine Phosphate     REACTION: urticaria (hives)   Current Outpatient Prescriptions on File Prior to Visit  Medication Sig Dispense Refill  . ARIPiprazole (ABILIFY) 5 MG tablet Take 5 mg by mouth daily.        . Cholecalciferol (VITAMIN D) 1000 UNITS capsule Take 1,000 Units by mouth daily.          OBJECTIVE:  BP 92/60  Pulse 80  Temp(Src) 97.7 F (36.5 C) (Oral)  Wt 135 lb 4 oz (61.349 kg)  LMP 10/12/2010  Appears well, in no apparent distress.  Vital signs are normal. The abdomen is soft without tenderness, guarding, mass, rebound or organomegaly. No CVA tenderness or inguinal adenopathy noted. Urine dipstick shows  negative for all components.  Micro exam: not done.   ASSESSMENT/PLAN: ?possible nephrolithiasis- passed stone. Currently asymptomatic with neg UA.   Treatment - push fluids. . Call or return to clinic prn if these symptoms worsen or fail to improve as anticipated.

## 2010-10-20 ENCOUNTER — Encounter: Payer: Self-pay | Admitting: Internal Medicine

## 2010-10-20 ENCOUNTER — Ambulatory Visit (INDEPENDENT_AMBULATORY_CARE_PROVIDER_SITE_OTHER): Payer: PRIVATE HEALTH INSURANCE | Admitting: Internal Medicine

## 2010-10-20 VITALS — BP 90/60 | HR 75 | Ht 61.5 in | Wt 135.6 lb

## 2010-10-20 DIAGNOSIS — G47419 Narcolepsy without cataplexy: Secondary | ICD-10-CM | POA: Insufficient documentation

## 2010-10-20 MED ORDER — AMPHETAMINE-DEXTROAMPHET ER 20 MG PO CP24: 20.0000 mg | ORAL_CAPSULE | ORAL | Status: DC

## 2010-10-20 MED ORDER — AMPHETAMINE-DEXTROAMPHETAMINE 10 MG PO TABS: 10.0000 mg | ORAL_TABLET | Freq: Two times a day (BID) | ORAL | Status: DC
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
## 2010-10-20 NOTE — Patient Instructions (Signed)
Scripts for 20 mg sustained action and 10 mg regular release Adderall to try as discussed.

## 2010-10-20 NOTE — Assessment & Plan Note (Addendum)
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 Education done. Will start with adderall- education done. We have carefully discussed potential mood, personality and physiologic changes, issues of dependency and tolerance controlled drug status. She is to contact her psychiatrist treating physician at [facility] about whether they will manage this, how to coordinate meds. Her bipolar disorder is managed through the [place].

## 2010-10-29 ENCOUNTER — Encounter: Payer: Self-pay | Admitting: Internal Medicine

## 2010-11-15 ENCOUNTER — Ambulatory Visit (INDEPENDENT_AMBULATORY_CARE_PROVIDER_SITE_OTHER): Payer: PRIVATE HEALTH INSURANCE | Admitting: Internal Medicine

## 2010-11-15 ENCOUNTER — Encounter: Payer: Self-pay | Admitting: Internal Medicine

## 2010-11-15 VITALS — BP 102/68 | HR 98 | Ht 61.5 in | Wt 130.6 lb

## 2010-11-15 DIAGNOSIS — G47419 Narcolepsy without cataplexy: Secondary | ICD-10-CM

## 2010-11-15 MED ORDER — AMPHETAMINE-DEXTROAMPHET ER 20 MG PO CP24: 20.0000 mg | ORAL_CAPSULE | Freq: Two times a day (BID) | ORAL | Status: DC

## 2010-11-15 NOTE — Assessment & Plan Note (Addendum)
She is tolerating and learing to use Adderall. There is room to go up if we can avoid overstimulation as discussed.  Plan allow adderall 20 ER twice daily, retaining 10 mg for prn.
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
## 2010-11-15 NOTE — Patient Instructions (Signed)
Adderall script printed to take ER 20 mg up to twice daily as needed Continue to use the 10 mg short release when needed  Don't forget flu vax at the [place].

## 2010-11-20 ENCOUNTER — Encounter: Payer: Self-pay | Admitting: Internal Medicine

## 2010-12-14 ENCOUNTER — Telehealth: Payer: Self-pay | Admitting: Internal Medicine

## 2010-12-14 MED ORDER — AMPHETAMINE-DEXTROAMPHETAMINE 10 MG PO TABS: 10.0000 mg | ORAL_TABLET | Freq: Two times a day (BID) | ORAL | Status: DC

## 2010-12-14 MED ORDER — AMPHETAMINE-DEXTROAMPHET ER 20 MG PO CP24: 20.0000 mg | ORAL_CAPSULE | Freq: Two times a day (BID) | ORAL | Status: DC

## 2010-12-14 NOTE — Telephone Encounter (Signed)
Spoke with patient-aware that RX's at front for pick up.

## 2010-12-14 NOTE — Telephone Encounter (Signed)
Prescriptions were printed and placed on Dr. Roxy Cedar cart for a signature. Please notify patient when prescriptions are ready for pick up.

## 2010-12-22 ENCOUNTER — Telehealth: Payer: Self-pay | Admitting: Internal Medicine

## 2010-12-22 NOTE — Telephone Encounter (Signed)
Per CY-okay to reschedule in 6 weeks ok to try Provigil.

## 2010-12-22 NOTE — Telephone Encounter (Signed)
Pt returned call.  I informed her CDY recs she reschedule in 6 wks and it was ok to try the Provigil.  She verbalized understanding of this and requesting to call back to schedule OV.  OV for tomorrow was cancelled -- pt aware of this.  Also, would like for me to inform CDY her BP was 86/49 Tuesday at the [facility].  Advised I would send message to CDY as FYI.

## 2010-12-22 NOTE — Telephone Encounter (Signed)
LMOMTCB

## 2010-12-22 NOTE — Telephone Encounter (Signed)
Called, spoke with pt.  States she was seen by her phychiatric on Tuesday.  He stopped the adderall because it interfers with Abilify, which she is currently on.  In place of the adderall, he started her on provigil.  She has not yet started this because she states she has to let the adderall get out of her system.  Also, she is waiting for this medication to come from the [place] but should be receiving it soon.  She has a pending OV with Dr. Maple Hudson tomorrow and would like to know if he would still like for her to come in considering the above.  Dr. Maple Hudson, pls advise.  Thanks!

## 2010-12-23 ENCOUNTER — Ambulatory Visit: Payer: Self-pay | Admitting: Internal Medicine

## 2011-02-28 ENCOUNTER — Ambulatory Visit (INDEPENDENT_AMBULATORY_CARE_PROVIDER_SITE_OTHER): Payer: PRIVATE HEALTH INSURANCE | Admitting: Family Medicine

## 2011-02-28 ENCOUNTER — Encounter: Payer: Self-pay | Admitting: Family Medicine

## 2011-02-28 VITALS — BP 108/70 | HR 64 | Temp 98.2°F | Wt 132.8 lb

## 2011-02-28 DIAGNOSIS — J069 Acute upper respiratory infection, unspecified: Secondary | ICD-10-CM | POA: Insufficient documentation

## 2011-02-28 NOTE — Patient Instructions (Signed)
I recommend lots of fluids and mucinex (or mucinex DM if cough) - to loosen the phlegm Tylenol or ibuprofen for pain or fever Extra rest if you can get it  Warm compresses to face for sinus fullness/ breathe steam  Nasal saline spray helps congestion too  In 7 days if worse or not starting to improve at all - let us know  I do recommend a flu vaccine when you are totally better

## 2011-02-28 NOTE — Assessment & Plan Note (Signed)
Uri viral with some diarrhea (may be assoc with otc cold med )  Disc symptomatic care - see instructions on AVS  If worse or not starting to improve in 7 days - inst to alert us/ follow up

## 2011-02-28 NOTE — Progress Notes (Signed)
Subjective:    Patient ID: Susan Barron, female    DOB: [birth year], 35 y.o.   MRN: [id number]  HPI Has had symptoms for 3 days  Does not feel like the flu - no aches or fever or vomiting   Headache- sinus pressure  Stopped up and running clear  Some facial soreness Sneeze and cough and st and pressure in ears - not pain   Diarrhea -- a little nausea  No vomiting  Very runny stool and smells funny -- and no blood in stool No cramps or abominal pain   Is taking some nyquil  Helps a little bit   Patient Active Problem List  Diagnoses  . HPV  . THYROIDITIS  . LEUKOCYTOPENIA NOS  . BIPOLAR AFFECTIVE DISORDER  . ANXIETY  . IBS  . TELOGEN EFFLUVIUM  . FATIGUE  . ABNORMAL PAP SMEAR  . HEAD TRAUMA  . Other Specified Personal History Presenting Hazards to Health  . Hypersomnia, organic  . Narcolepsy without cataplexy  . Viral URI   Past Medical History  Diagnosis Date  . Anxiety   . Abnormal glandular Papanicolaou smear of cervix   . Status post bunionectomy   . Thyroiditis, unspecified   . Bipolar disorder, unspecified   . History of colposcopy with cervical biopsy   . 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 Irritable bowel syndrome   . Narcolepsy without cataplexy    Past Surgical History  Procedure Date  . Bunionectomy 1998 % 1999    x 2   . Breast enhancement surgery 2011   History  Substance Use Topics  . Smoking status: Never Smoker   . Smokeless tobacco: Never Used  . Alcohol Use: No     rare   No family history on file. Allergies  Allergen Reactions  . Codeine Phosphate     REACTION: urticaria (hives)   Current Outpatient Prescriptions on File Prior to Visit  Medication Sig Dispense Refill  . amphetamine-dextroamphetamine (ADDERALL XR, 20MG ,) 20 MG 24 hr capsule Take 1 capsule (20 mg total) by mouth 2 (two) times daily. As needed  60 capsule  0  . amphetamine-dextroamphetamine (ADDERALL, 10MG ,) 10 MG  tablet Take 1 tablet (10 mg total) by mouth 2 (two) times daily. If needed  60 tablet  0  . ARIPiprazole (ABILIFY) 5 MG tablet Take 5 mg by mouth daily.        . Cholecalciferol (VITAMIN D) 1000 UNITS capsule Take 1,000 Units by mouth daily.            Review of Systems Review of Systems  Constitutional: Negative for fever, appetite change, fatigue and unexpected weight change.  Eyes: Negative for pain and visual disturbance.  ENT pos for nasal congestion/ ear and sinus fullness/ no severe st  Respiratory: Negative for sob or wheeze   Cardiovascular: Negative for cp or palpitations    Gastrointestinal: Negative for nausea, diarrhea and constipation.  Genitourinary: Negative for urgency and frequency.  Skin: Negative for pallor or rash   Neurological: Negative for weakness, light-headedness, numbness and headaches.  Hematological: Negative for adenopathy. Does not bruise/bleed easily.  Psychiatric/Behavioral: Negative for dysphoric mood. The patient is not nervous/anxious.          Objective:   Physical Exam  Constitutional: She appears well-developed and well-nourished.  HENT:  Head: Normocephalic and atraumatic.  Right Ear: External ear normal.  Left Ear: External ear normal.  Mouth/Throat: Oropharynx is clear and moist. No  oropharyngeal exudate.       Nares are injected and congested   Mild ethmoid sinus tenderness TMs dull Clear post nasal drip  Eyes: Conjunctivae and EOM are normal. Pupils are equal, round, and reactive to light. Right eye exhibits no discharge. Left eye exhibits no discharge.  Neck: Normal range of motion. Neck supple. No thyromegaly present.  Cardiovascular: Normal rate, regular rhythm and normal heart sounds.   Pulmonary/Chest: Effort normal and breath sounds normal. No respiratory distress. She has no wheezes. She has no rales.  Abdominal: Soft. Bowel sounds are normal. She exhibits no distension. There is no tenderness.  Lymphadenopathy:    She has  no cervical adenopathy.  Skin: Skin is warm and dry. No rash noted.  Psychiatric: She has a normal mood and affect.          Assessment & Plan:

## 2011-03-02 ENCOUNTER — Ambulatory Visit (INDEPENDENT_AMBULATORY_CARE_PROVIDER_SITE_OTHER): Payer: PRIVATE HEALTH INSURANCE | Admitting: Family Medicine

## 2011-03-02 ENCOUNTER — Encounter: Payer: Self-pay | Admitting: Family Medicine

## 2011-03-02 VITALS — BP 104/62 | HR 80 | Temp 98.3°F | Wt 132.8 lb

## 2011-03-02 DIAGNOSIS — Z639 Problem related to primary support group, unspecified: Secondary | ICD-10-CM

## 2011-03-02 DIAGNOSIS — F411 Generalized anxiety disorder: Secondary | ICD-10-CM

## 2011-03-02 DIAGNOSIS — F410 Panic disorder [episodic paroxysmal anxiety] without agoraphobia: Secondary | ICD-10-CM | POA: Insufficient documentation

## 2011-03-02 MED ORDER — ALPRAZOLAM 0.25 MG PO TABS: 0.2500 mg | ORAL_TABLET | Freq: Three times a day (TID) | ORAL | Status: AC | PRN

## 2011-03-02 NOTE — Progress Notes (Signed)
  Subjective:    Patient ID: Susan Barron, female    DOB: [birth year], 36 y.o.   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  Feels at breaking point.  Was here several days ago for head cold, asked to be evalauated then, unable to do.  Has special needs child (8yo) that needs to be institutionalized, possibly in [place].  Malen Gauze family suddenly unable to care for her anymore so pt is having to make numerous doctor visits with daughter, increased workload.  Having panic attacks - anxiety, crying, shortness of breath, some chest tightness, crying, irritable, snappy, impatient.  Feeling very frail.  This has been going on for weeks, has been unable to find help, recently more trouble 2/2 holidays.  Has had panic attacks in past, but recently life stresses compounding.  Goes to [place] but unable to get in with them for several weeks.  Has been on xanax and klonopin in past.  Holidays have been tough because less support available for pt and for her daughter.    + some depression as well but feels abilify controlling bipolar well.  No highs.  Denies SI/HI.  Was on wellbutrin this past month prescribed by [place], but only had 1 mo supply and unable to reach them for refill so self tapered off when she realized she was running out.  Unable to afford counseling except at [place] - will start in February, nothing available sooner.  Stress relieving strategies - running but not recently.  Not much time for self.  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  Review of Systems Per HPI    Objective:   Physical Exam  Nursing note and vitals reviewed. Constitutional: She appears well-developed and well-nourished. No distress.  Psychiatric: Her speech is normal and behavior is normal. Judgment and thought content normal. Her mood  appears anxious. Cognition and memory are normal.       Restless, brink of tears at several points during visit      Assessment & Plan:

## 2011-03-02 NOTE — Assessment & Plan Note (Addendum)
Compounded by recent family stressor in form of daughter. Do not think bipolar contributing currently, seems stable on abilify. Will start xanax at 0.25mg  bid to tid, may take 1/2 pill as pt states very sensitive to this.  Discussed sedation and driving precautions. rec f/u with psych at [facility]. Already has appt pending with counselor at [facility]. Update [place] if not improving as expected.

## 2011-03-02 NOTE — Patient Instructions (Signed)
I'm sorry you have so much going on right now, sounds pretty overwhelming. We will start xanax 0.25mg  to take 2-3 per day.   [facility]. Let us know how things are going.

## 2011-03-02 NOTE — Assessment & Plan Note (Signed)
Situation with daughter contributing significantly to stress.

## 2012-05-02 ENCOUNTER — Telehealth (HOSPITAL_COMMUNITY): Payer: Self-pay

## 2012-05-02 ENCOUNTER — Encounter (HOSPITAL_COMMUNITY): Payer: Self-pay
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
## 2012-05-03 ENCOUNTER — Inpatient Hospital Stay (HOSPITAL_COMMUNITY)
Admission: EM | Admit: 2012-05-03 | Discharge: 2012-05-07 | DRG: 885 | Disposition: A | Discharge status: Home / Self Care | Payer: No Typology Code available for payment source | Source: Other Acute Inpatient Hospital | Attending: Psychiatry | Admitting: Psychiatry

## 2012-05-03 ENCOUNTER — Encounter (HOSPITAL_COMMUNITY): Payer: Self-pay | Admitting: *Deleted

## 2012-05-03 DIAGNOSIS — F332 Major depressive disorder, recurrent severe without psychotic features: Principal | ICD-10-CM | POA: Diagnosis present

## 2012-05-03 DIAGNOSIS — R45851 Suicidal ideations: Secondary | ICD-10-CM

## 2012-05-03 DIAGNOSIS — F329 Major depressive disorder, single episode, unspecified: Secondary | ICD-10-CM | POA: Diagnosis present

## 2012-05-03 DIAGNOSIS — F411 Generalized anxiety disorder: Secondary | ICD-10-CM | POA: Diagnosis present

## 2012-05-03 DIAGNOSIS — Z79899 Other long term (current) drug therapy: Secondary | ICD-10-CM

## 2012-05-03 DIAGNOSIS — F319 Bipolar disorder, unspecified: Secondary | ICD-10-CM

## 2012-05-03 MED ORDER — TRAZODONE HCL 100 MG PO TABS
100.0000 mg | ORAL_TABLET | Freq: Once | ORAL | Status: AC
  Administered 2012-05-03: 100 mg via ORAL
  Filled 2012-05-03 (×2): qty 1

## 2012-05-03 MED ORDER — TRAZODONE HCL 50 MG PO TABS
50.0000 mg | ORAL_TABLET | Freq: Every day | ORAL | Status: DC
  Administered 2012-05-03: 50 mg via ORAL
  Filled 2012-05-03 (×4): qty 1

## 2012-05-03 MED ORDER — MAGNESIUM HYDROXIDE 400 MG/5ML PO SUSP: 30.0000 mL | Freq: Every day | ORAL | Status: DC | PRN

## 2012-05-03 MED ORDER — ALUM & MAG HYDROXIDE-SIMETH 200-200-20 MG/5ML PO SUSP: 30.0000 mL | ORAL | Status: DC | PRN

## 2012-05-03 MED ORDER — ARIPIPRAZOLE 10 MG PO TABS
10.0000 mg | ORAL_TABLET | Freq: Every day | ORAL | Status: DC
  Administered 2012-05-03 – 2012-05-06 (×4): 10 mg via ORAL
  Filled 2012-05-03 (×7): qty 1

## 2012-05-03 MED ORDER — ACETAMINOPHEN 325 MG PO TABS
650.0000 mg | ORAL_TABLET | Freq: Four times a day (QID) | ORAL | Status: DC | PRN
Start: 2012-05-03 — End: 2012-05-07
  Administered 2012-05-04: 650 mg via ORAL

## 2012-05-03 NOTE — Progress Notes (Signed)
Pt attended Susan Barron, actively participating by singing 3 songs.
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
## 2012-05-03 NOTE — Progress Notes (Signed)
Patient ID: MATEYA TORTI, female   DOB: 1975/11/04, 37 y.o.   MRN: [id number] Spoke with patient regarding a visitor, Lesly Rubenstein, who came to [facility] ealier today to speak with Counselor providing care to patient, and bring items for patient.  In addition he wanted to speak to the patient.  The patient was not transferred to [facility] at that time.  Notified patient that he is requesting to speak with her and patient was informed her code number is required and  that she must provide the code number to him.  [facility] staff cannot provide the code number to him on her behalf.  She verbalized an understanding of the information above. Patient remains in bed resting. She stated she would call Mr. Christell Constant later and it was all right for him to visit; however, she did not want him involved in her treatment at this time.   Darryll Capers,  RN, AD was present with Clinical research associate.  Oliva Bustard 05/03/2012 15:45

## 2012-05-03 NOTE — Progress Notes (Signed)
Patient ID: Susan Barron, female   DOB: Jul 31, 1975, 37 y.o.   MRN: [id number] D: pt. Reports "just here", reports she don't know how being a patient will be helpful "no expectations". Pt. Reports depression at "9" of 10. Pt. Reports passive SI, but contracts for safety. A: Writer introduced self to client, encouraged her to verbalize feeling. Staff encouraged group. R: Pt. Is safe on the unit. Pt. Went to [employer].
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
## 2012-05-04 DIAGNOSIS — F3130 Bipolar disorder, current episode depressed, mild or moderate severity, unspecified: Secondary | ICD-10-CM

## 2012-05-04 MED ORDER — CLONAZEPAM 1 MG PO TABS
1.0000 mg | ORAL_TABLET | Freq: Two times a day (BID) | ORAL | Status: DC | PRN
  Administered 2012-05-04: 1 mg via ORAL
  Filled 2012-05-04: qty 1

## 2012-05-04 MED ORDER — TRAZODONE HCL 50 MG PO TABS
150.0000 mg | ORAL_TABLET | Freq: Every day | ORAL | Status: DC
  Administered 2012-05-04 – 2012-05-06 (×3): 150 mg via ORAL
  Filled 2012-05-04 (×5): qty 3
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
## 2012-05-04 NOTE — Progress Notes (Signed)
D: Patient denies HI and A/V hallucinations and on and off thoughts of SI; patient reports that she requested medication for sleep but it did not help; reports appetite is improving ; reports energy level is low ; reports ability to pay attention is poor; rates depression as 9/10; rates hopelessness 9/10;   A: Monitored q 15 minutes; patient encouraged to attend groups; patient educated about medications; patient given medications per physician orders; patient encouraged to express feelings and/or concerns  R: Patient first observed laughing and joking with her peers but when approached patient appears sad and flat; when physician wanted to interview the patient she tried to hide because she said she knew him; patient's interaction with staff and peers is appropriate; patient was able to set goal to talk with staff 1:1 when having feelings of SI; patient is attending all groups

## 2012-05-04 NOTE — Progress Notes (Signed)
[facility] MD Progress Note  05/04/2012 8:18 PM Susan Barron  MRN:  [id number] Subjective:  Patient was seen during afternoon rounds. Patient has been struggling with a lot of stresses which increased her symptoms of depression. Patient was know to his provider over a year from out patient psychiatric office. She was never seen with current mental status which shows decreased psychomotor activity, hopeless, helpless, and feels lost her battle of obtaining enhanced services to her daughter with severe mental illness and severe behavioral dysregulation after working with different providers in the community and managed care organization who repeatedly declined coverage needed for her. She has other stresses from her past trauma, medical conditions and loss of her dog etc. She has failed to take her antidepressant due to increased weight gain and she was also diagnosed with bipolar and PTSD. Patient mother was supportive to her and give occasional relief to her but not enough respite care.   Diagnosis:  Axis I: Bipolar, Depressed  ADL's:  Impaired  Sleep: Fair  Appetite:  Poor  Suicidal Ideation:  Plan:  YES Intent:  YES Means:  YES Homicidal Ideation:  Plan:  NO Intent:  NO Means:  NO AEB (as evidenced by):  Psychiatric Specialty Exam: ROS  Blood pressure 105/70, pulse 98, temperature 97.3 F (36.3 C), temperature source Oral, resp. rate 20, height 5' 3.5" (1.613 m), weight 161 lb (73.029 kg), last menstrual period 04/26/2012, SpO2 99.00%.Body mass index is 28.07 kg/(m^2).  General Appearance: Disheveled and Poorly groomed.  Eye Contact::  Fair  Speech:  Clear and Coherent and Slow  Volume:  Decreased  Mood:  Anxious, Depressed, Dysphoric, Hopeless and Worthless  Affect:  Constricted, Depressed, Flat and Tearful  Thought Process:  Coherent, Linear and Logical  Orientation:  Full (Time, Place, and Person)  Thought Content:  Rumination  Suicidal Thoughts:  Yes.  with intent/plan  Homicidal  Thoughts:  No  Memory:  Immediate;   Fair  Judgement:  Impaired  Insight:  Lacking  Psychomotor Activity:  Decreased  Concentration:  Poor  Recall:  Fair  Akathisia:  No  Handed:  Right  AIMS (if indicated):     Assets:  Architect Physical Health Resilience Social Support  Sleep:  Number of Hours: 4.75   Current Medications: Current Facility-Administered Medications  Medication Dose Route Frequency Provider Last Rate Last Dose  . acetaminophen (TYLENOL) tablet 650 mg  650 mg Oral Q6H PRN Verne Spurr, PA-C   650 mg at 05/04/12 2000  . alum & mag hydroxide-simeth (MAALOX/MYLANTA) 200-200-20 MG/5ML suspension 30 mL  30 mL Oral Q4H PRN Verne Spurr, PA-C      . ARIPiprazole (ABILIFY) tablet 10 mg  10 mg Oral QHS Verne Spurr, PA-C   10 mg at 05/03/12 2203  . clonazePAM (KLONOPIN) tablet 1 mg  1 mg Oral BID PRN Nehemiah Settle, MD   1 mg at 05/04/12 1710  . magnesium hydroxide (MILK OF MAGNESIA) suspension 30 mL  30 mL Oral Daily PRN Verne Spurr, PA-C      . traZODone (DESYREL) tablet 150 mg  150 mg Oral QHS Nehemiah Settle, MD        Lab Results: No results found for this or any previous visit (from the past 48 hour(s)).  Physical Findings: AIMS: Facial and Oral Movements Muscles of Facial Expression: None, normal Lips and Perioral Area: None, normal Jaw: None, normal Tongue: None, normal,Extremity Movements Upper (arms, wrists, hands, fingers): None, normal Lower (legs, knees, ankles,  toes): None, normal, Trunk Movements Neck, shoulders, hips: None, normal, Overall Severity Severity of abnormal movements (highest score from questions above): None, normal Incapacitation due to abnormal movements: None, normal Patient's awareness of abnormal movements (rate only patient's report): No Awareness, Dental Status Current problems with teeth and/or dentures?: No Does patient usually wear dentures?: No  CIWA:  CIWA-Ar  Total: 2 COWS:  COWS Total Score: 3  Treatment Plan Summary: Daily contact with patient to assess and evaluate symptoms and progress in treatment Medication management  Plan: Will increase Trazodone 150 mg Qhs, she slept only four hours with 50mg  of trazodone. She may take clonazepam 1 mg at bed time for anxiety and insomnia and continue current medications abilify for bipolar depression. Encouraged to participate in patient program as scheduled. ELS is 5 days.   Medical Decision Making Problem Points:  Established problem, worsening (2), New problem, with no additional work-up planned (3) and Review of psycho-social stressors (1) Data Points:  Review or order clinical lab tests (1) Review of medication regiment & side effects (2)  I certify that inpatient services furnished can reasonably be expected to improve the patient's condition.   JONNALAGADDA,JANARDHAHA R. 05/04/2012, 8:18 PM
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
## 2012-05-04 NOTE — Progress Notes (Signed)
[facility] LCSW Group Therapy        Feelings Around Relapse        1:15-2:30 PM    05/04/2012 3:01 PM  Type of Therapy:  Group Therapy  Participation Level:  None  Participation Quality:  Inattentive  Affect:  Blunted, Depressed and Flat  Cognitive:  Lacking  Insight:  Poor   Engagement in Therapy:  Lacking  Modes of Intervention:  Discussion, Education, Exploration, Problem-solving, Rapport Building and Support  Summary of Progress/Problems:  Patient remained in group but did not particpate.  [name] 05/04/2012, 3:01 PM

## 2012-05-04 NOTE — Progress Notes (Signed)
Goal's Group Note  Date 05/04/2012  Time: 1015  Group Topic/Focus: Identifying Goals : This group focuses on helping patients identify goals they want to work towards as well as motivates them to begin to make positive changes in their lives. I    .  Participation Level:  Active  Participation Quality: good  Affect: flat  Cognitive:  Minimal  Insight:  good  Engagement in Group: engaged  Additional Comments:   PDuke RN [employer]
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
## 2012-05-04 NOTE — Progress Notes (Signed)
Recreation Therapy Notes  Date: 03.07.2014  Time: 3:00pm  Location: Art Room   Group Topic/Focus: Leisure Education   Participation Level:  Did not attend.   Marykay Lex Blanchfield, LRT/CTRS  Jearl Klinefelter 05/04/2012 4:27 PM

## 2012-05-04 NOTE — [facility] Suicide Risk Assessment (Signed)
Suicide Risk Assessment  Admission Assessment     Nursing information obtained from:    Demographic factors:    Current Mental Status:    Loss Factors:    Historical Factors:    Risk Reduction Factors:     CLINICAL FACTORS:   Depression:   Anhedonia Hopelessness Impulsivity Insomnia Recent sense of peace/wellbeing Severe  COGNITIVE FEATURES THAT CONTRIBUTE TO RISK:  Closed-mindedness Polarized thinking    SUICIDE RISK:   Mild:  Suicidal ideation of limited frequency, intensity, duration, and specificity.  There are no identifiable plans, no associated intent, mild dysphoria and related symptoms, good self-control (both objective and subjective assessment), few other risk factors, and identifiable protective factors, including available and accessible social support.  PLAN OF CARE: Admit to [facility] adult unit for safety, secure therapeutic environment and crisis stabilization. Will increase trazodone 150 mg for insomnia and the clonazepam 1 mg at at bedtime for sleep and anxiety. Her affect.  I certify that inpatient services furnished can reasonably be expected to improve the patient's condition.   JONNALAGADDA,JANARDHAHA R. 05/04/2012, 12:45 PM

## 2012-05-05 MED ORDER — MODAFINIL 200 MG PO TABS
200.0000 mg | ORAL_TABLET | Freq: Every day | ORAL | Status: DC
  Administered 2012-05-06 – 2012-05-07 (×2): 200 mg via ORAL
  Filled 2012-05-05 (×2): qty 1

## 2012-05-05 NOTE — Clinical Social Work Note (Signed)
[facility] Group Notes:  (Clinical Social Work)  05/05/2012   3:00-4:00PM  Summary of Progress/Problems:   The main focus of today's process group was for the patient to identify something in their life that led to their hospitalization that they would like to change, then to discuss their motivation to change.  The Stages of Change were explained to the group, then each patient identified where they are in that process and what actions they are taking toward change within that stage.  The patient expressed that her boyfriend has been cheating on her repeatedly and she does not like the person she has become as a result, stalking him all the time to see what he is doing wrong.  She does not feel she can leave him because she is financially dependent.  She states she is in the contemplation Stage of Change, and her motivation is 5 out of 10 (not higher because she is filled with fear).  Type of Therapy:  Process Group  Participation Level:  Active  Participation Quality:  Attentive and Sharing  Affect:  Blunted and Depressed  Cognitive:  Appropriate and Oriented  Insight:  Developing/Improving  Engagement in Therapy:  Engaged  Modes of Intervention:  Clarification, Support and Processing, Exploration, Discussion   Ambrose Mantle, LCSW 05/05/2012, 4:13 PM
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
## 2012-05-05 NOTE — Progress Notes (Addendum)
D Susan Barron is seen out in the milieu. She interacts fairly well with her peers and with the staff. A She completed her self inventory and on it she wrote she cont to have positive SI, she rated her depression and hopelesness " 8 / 8 " and she stated she " didn't know" what her DC plan will be.   A SHe has attended her groups today, she shared personal feelngs with the group and she is actively trying to understand her feelings and related behaviors.   R Safety is in place and _POC cont with therapeutic relationhip is fostered.

## 2012-05-05 NOTE — Progress Notes (Signed)
Patient ID: LAQUEISHA CATALINA, female   DOB: [birth year], 37 y.o.   MRN: [id number]  D: No new data from previous assessment. Patient in bed sleeping. Respiration regular and unlabored. No sign of distress noted at this time A: 15 mins checks for  Safety. R: Patient remains asleep. Pt is safe.
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
## 2012-05-05 NOTE — Progress Notes (Signed)
Psychoeducational Group Note  Date:  05/05/2012 Time:  2000  Group Topic/Focus:  Wrap-Up Group:   The focus of this group is to help patients review their daily goal of treatment and discuss progress on daily workbooks.  Participation Level: Did Not Attend  Participation Quality:  Not Applicable  Affect:  Not Applicable  Cognitive:  Not Applicable  Insight:  Not Applicable  Engagement in Group: Not Applicable  Additional Comments:  The patient did not attend group since she was in conference with her doctor.   Hazle Coca S 05/05/2012, 10:37 PM

## 2012-05-06 MED ORDER — BENZONATATE 100 MG PO CAPS
100.0000 mg | ORAL_CAPSULE | Freq: Three times a day (TID) | ORAL | Status: DC | PRN
  Administered 2012-05-06 – 2012-05-07 (×4): 100 mg via ORAL
  Filled 2012-05-06 (×4): qty 1

## 2012-05-06 NOTE — Progress Notes (Signed)
Psychoeducational Group Note  Date: 05/06/2012 Time: 1015  Group Topic/Focus:  Making Healthy Choices:   The focus of this group is to help patients identify negative/unhealthy choices they were using prior to admission and identify positive/healthier coping strategies to replace them upon discharge.  Participation Level:  Active  Participation Quality:  Appropriate  Affect:  Appropriate  Cognitive:  Appropriate  Insight:  Engaged  Engagement in Group:  Engaged  Additional Comments:    05/06/2012,6:38 PM Duke, Joie Bimler

## 2012-05-06 NOTE — Progress Notes (Signed)
[facility] Group Notes:  (Nursing/MHT/Case Management/Adjunct)  Date:  05/06/2012  Time:  2000  Type of Therapy:  Psychoeducational Skills  Participation Level:  Active  Participation Quality:  Appropriate  Affect:  Appropriate  Cognitive:  Appropriate  Insight:  Improving  Engagement in Group:  Improving  Modes of Intervention:  Education  Summary of Progress/Problems: The patient expressed in group that she had a better day. She stated that she experienced some "positive breakthroughs" with her treatment. She also mentioned that she is dealing with cold symptoms. The patient mentioned that she does not believe in setting goals for herself since she gets depressed when she isn't able to complete those goals.   Hazle Coca S 05/06/2012, 8:57 PM

## 2012-05-06 NOTE — Progress Notes (Signed)
Susan Barron is seen out in the milieu today. 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 SHe completed her AM self inventory and on it she wrote she denied SI in the past 24 hrs, she rated her depression and hopelessness " 4 / 5 ", respectively  and she stated her DC plan is to : " decrease work schedule"   A SHe attends her groups, is engaged in her POC and is actively trying to learn healthier coping skills.   R Safety is in place, pt was given tessalon pearls for c'o cough and therapeutic relationship is fostered.

## 2012-05-06 NOTE — Progress Notes (Signed)
Psychoeducational Group Note  Psychoeducational Group Note  Date: 05/06/2012 Time:  05/06/2012  Group Topic/Focus:  Gratefulness:  The focus of this group is to help patients identify what two things they are most grateful for in their lives. What helps ground them and to center them on their work to their recovery.  Participation Level:  Active  Participation Quality:  Attentive  Affect:  Appropriate  Cognitive:  Appropriate  Insight:  Engaged  Engagement in Group:  Engaged  Additional Comments:   Dione Housekeeper

## 2012-05-06 NOTE — Clinical Social Work Note (Signed)
[facility] Group Notes:  (Clinical Social Work)  05/06/2012   3:00-4:00PM  Summary of Progress/Problems:    The main focus of today's process group was to define "support" and describe what healthy supports are.  We then discussed how and why to increase patient supports, using motivational interviewing.  An emphasis was placed on using counselor, doctor, therapy groups, self-help groups and problem-specific support groups to expand supports.   The patient expressed that she is turning a corner, is not caring about people who have not supported her, or not supporting her now such as family members who have not called while she has not been there.  Was animated and insightful.  Type of Therapy:  Process Group  Participation Level:  Active  Participation Quality:  Appropriate, Attentive, Sharing and Supportive  Affect:  Appropriate  Cognitive:  Alert, Appropriate and Oriented  Insight:  Engaged  Engagement in Therapy:  Engaged  Modes of Intervention:  Education,  Support and Processing, Exploration, Discussion   Ambrose Mantle, LCSW 05/06/2012, 4:02 PM

## 2012-05-07 DIAGNOSIS — F329 Major depressive disorder, single episode, unspecified: Secondary | ICD-10-CM | POA: Diagnosis present

## 2012-05-07 DIAGNOSIS — F332 Major depressive disorder, recurrent severe without psychotic features: Principal | ICD-10-CM

## 2012-05-07 DIAGNOSIS — F411 Generalized anxiety disorder: Secondary | ICD-10-CM

## 2012-05-07 MED ORDER — ARIPIPRAZOLE 10 MG PO TABS: 10.0000 mg | ORAL_TABLET | Freq: Every day | ORAL | Status: DC

## 2012-05-07 MED ORDER — TRAZODONE HCL 150 MG PO TABS
150.0000 mg | ORAL_TABLET | Freq: Every day | ORAL | Status: DC
Start: 2012-05-07 — End: 2017-05-26

## 2012-05-07 MED ORDER — MODAFINIL 200 MG PO TABS: 200.0000 mg | ORAL_TABLET | Freq: Two times a day (BID) | ORAL | Status: DC

## 2012-05-07 NOTE — [facility] Suicide Risk Assessment (Signed)
Suicide Risk Assessment  Discharge Assessment     Demographic Factors:  Female, caucasian, divorced  Mental Status Per Nursing Assessment::   On Admission:     Current Mental Status by Physician: Alert and oriented to 4. Denies AH/VH/SI/HI.  Loss Factors: Financial problems/change in socioeconomic status  Historical Factors: Impulsivity  Risk Reduction Factors:   Employed, Positive social support and Positive coping skills or problem solving skills  Continued Clinical Symptoms:  Bipolar Disorder:   Depressive phase  Cognitive Features That Contribute To Risk:  Cognitively intact  Suicide Risk:  Minimal: No identifiable suicidal ideation.  Patients presenting with no risk factors but with morbid ruminations; may be classified as minimal risk based on the severity of the depressive symptoms  Discharge Diagnoses:   AXIS I:  Bipolar, mixed AXIS II:  Deferred AXIS III:   Past Medical History  Diagnosis Date  . Anxiety   . Abnormal glandular Papanicolaou smear of cervix   . Status post bunionectomy   . Thyroiditis, unspecified   . Bipolar disorder, unspecified   . History of colposcopy with cervical biopsy   . 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 Irritable bowel syndrome   . Narcolepsy without cataplexy    AXIS IV:  educational problems and other psychosocial or environmental problems AXIS V:  61-70 mild symptoms  Plan Of Care/Follow-up recommendations:  Activity:  regular Diet:  regular Follow up with outpatient appointments.  Is patient on multiple antipsychotic therapies at discharge:  No   Has Patient had three or more failed trials of antipsychotic monotherapy by history:  No  Recommended Plan for Multiple Antipsychotic Therapies: NA  RAVI, HIMABINDU 05/07/2012, 12:01 PM

## 2012-05-07 NOTE — Progress Notes (Signed)
[facility] LCSW Group Therapy        Overcoming Obstacles 1:15 2:30 PM         05/07/2012 3:31 PM  Type of Therapy:  Group Therapy  Participation Level:  Active  Participation Quality:  Appropriate and Attentive  Affect:  Appropriate  Cognitive:  Alert and Appropriate  Insight:  Engaged  Engagement in Therapy:  Engaged  Modes of Intervention:  Discussion, Education, Problem-solving, Rapport Building and Support  Summary of Progress/Problems:  Patient shared the obstacle she has to overcome is lack of awareness that there is an obstacle.  She stated she knows there is an obstacle but continues to walk into it.  She stated this time it almost got the best of her.  [name] 05/07/2012, 3:31 PM

## 2012-05-07 NOTE — Progress Notes (Signed)
Assumed pt's care from [facility] at 2300. Pt has rested without complaint tonight. RR WNL, even and unlabored. Level III obs in place for safety. Pt remains safe. Lawrence Marseilles
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
## 2012-05-07 NOTE — Progress Notes (Signed)
Discharge Note:  Patient discharged home with boyfriend.  Denied SI and HI.   Denied A/V hallucinations.   Denied pain.  Patient stated she received all her belongings, cell phones, ID, charger, cosmetics, prescriptions.   Suicide prevention information given to patient, discussed with patient, who stated she understood and had no questions.  Patient stated she appreciated all the staff has done to assist her while at [facility].  Patient has been cooperative and pleasant.

## 2012-05-07 NOTE — Progress Notes (Signed)
D:  Patient's self inventory sheet, patent has poor sleep, improving appetite, normal energy level, good attention span.  Rated depression and hopelessness #1.  Denied withdrawals.  Denied SI.  Has experienced coughing and congestion.  Worst pain #4.  After discharge, plans to take more time for myself.  Denied SI and HI.   Denied A/V hallucinations.  Has decreased sleep. A:  Medications administered per MD order.  Support and encouragement given throughout day. R:  Following treatment plan.   Denied SI and HI.   Denied A/V hallucinations.  Contracts for safety.

## 2012-05-07 NOTE — Progress Notes (Signed)
Adult Psychoeducational Group Note  Date:  05/07/2012 Time:  11:37 AM  Group Topic/Focus:  Wellness Toolbox:   The focus of this group is to discuss various aspects of wellness, balancing those aspects and exploring ways to increase the ability to experience wellness.  Patients will create a wellness toolbox for use upon discharge.  Participation Level:  Minimal  Participation Quality:  Appropriate and Sharing  Affect:  Appropriate  Cognitive:  Appropriate  Insight: Appropriate  Engagement in Group:  Engaged  Modes of Intervention:  Discussion  Additional Comments:  Pt shared that she was still working on stabilizing her emotions while in the facilties. Pt shared how water aerobics was a great way to improve her fitness.   Sharyn Lull 05/07/2012, 11:37 AM

## 2012-05-07 NOTE — Progress Notes (Signed)
[facility] INPATIENT:  Family/Significant Other Suicide Prevention Education  Suicide Prevention Education:  Education Completed:  Susan Barron,  [phone]) has been identified by the patient as the family member/significant other with whom the patient will be residing, and identified as the person(s) who will aid the patient in the event of a mental health crisis (suicidal ideations/suicide attempt).  With written consent from the patient, the family member/significant other has been provided the following suicide prevention education, prior to the and/or following the discharge of the patient.  The suicide prevention education provided includes the following:  Suicide risk factors  Suicide prevention and interventions  National Suicide Hotline telephone number  [facility] assessment telephone number  [facility] Emergency Assistance 911  [facility] and/or Residential Mobile Crisis Unit telephone number  Request made of family/significant other to:  Remove weapons (e.g., guns, rifles, knives), all items previously/currently identified as safety concern.  Boyfriend reports guns are secured.  Remove drugs/medications (over-the-counter, prescriptions, illicit drugs), all items previously/currently identified as a safety concern.  The family member/significant other verbalizes understanding of the suicide prevention education information provided.  The family member/significant other agrees to remove the items of safety concern listed above.  Susan Barron 05/07/2012, 2:52 PM

## 2012-05-07 NOTE — Tx Team (Signed)
Interdisciplinary Treatment Plan Update   Date Reviewed:  05/07/2012  Time Reviewed:  10:11 AM  Progress in Treatment:   Attending groups: Yes Participating in groups: Yes Taking medication as prescribed: Yes  Tolerating medication: Yes Family/Significant other contact made: Yes, made with boyfriend.  Patient understands diagnosis: Yes  Discussing patient identified problems/goals with staff: Yes Medical problems stabilized or resolved: Yes Denies suicidal/homicidal ideation: Yes Patient has not harmed self or others: Yes  For review of initial/current patient goals, please see plan of care.  Estimated Length of Stay:  Discharge today  Reasons for Continued Hospitalization:   New Problems/Goals identified:    Discharge Plan or Barriers:   Home with outpatient follow up with [facility].  Additional Comments:  Attendees:  Patient:  05/07/2012 10:11 AM   Signature: Patrick North, MD 05/07/2012 10:11 AM  Signature:  Waynetta Sandy, RN 05/07/2012 10:11 AM  Signature: Fransisca Kaufmann, [facility] 05/07/2012 10:11 AM  Signature:Beverly Terrilee Croak, RN 05/07/2012 10:11 AM  Signature:   05/07/2012 10:11 AM  Signature:  Juline Patch, LCSW 05/07/2012 10:11 AM  Signature: Silverio Decamp, PMH-NP 05/07/2012 10:11 AM  Signature:  05/07/2012 10:11 AM  Signature:    Signature:    Signature:    Signature:      Scribe for Treatment Team:   Juline Patch,  05/07/2012 10:11 AM

## 2012-05-07 NOTE — Progress Notes (Signed)
[facility] Adult Case Management Discharge Plan :  Will you be returning to the same living situation after discharge: Yes,  Patient is returning to her home. At discharge, do you have transportation home?:Yes,  Patient to arrange her own transportation. Do you have the ability to pay for your medications:Yes,  Patient has Medicaid.  Release of information consent forms completed and in the chart;  Patient's signature needed at discharge.  Patient to Follow up at: Follow-up Information   Follow up with [facility] On 05/11/2012. (Friday, May 11, 2012 at 11:00 AM)    Contact information:   [address] [place], [place]   [address]  [phone]      Follow up with Dr. Loreen Freud [facility] On 06/13/2012. (Wednesday, June 13, 2012 at 3:30 PM)    Contact information:   [address] [place], [place]  [address]  [phone]      Patient denies SI/HI:   Yes,  Patient is not endorsing SI/HI or thoughts of self harm.    Safety Planning and Suicide Prevention discussed:  Yes,  Reviewed during aftercare group.  [name] 05/07/2012, 3:03 PM

## 2012-05-08 NOTE — Discharge Summary (Signed)
Reviewed

## 2012-05-11 NOTE — Progress Notes (Signed)
Patient Discharge Instructions:  After Visit Summary (AVS):   Faxed to:  05/11/12 Discharge Summary Note:   Faxed to:  05/11/12 Psychiatric Admission Assessment Note:   Faxed to:  05/11/12 Suicide Risk Assessment - Discharge Assessment:   Faxed to:  05/11/12 Faxed/Sent to the Next Level Care provider:  05/11/12 Faxed to [facility] @ [phone] Records sent via mail to: [facility] - Dr. Loreen Freud [address] [place], [place] [address]   Jerelene Redden, 05/11/2012, 1:47 PM

## 2012-05-12 NOTE — H&P (Signed)
Reviewed the information documented and agree with the treatment plan.  JONNALAGADDA,JANARDHAHA R. 05/12/2012 11:54 AM

## 2016-12-31 ENCOUNTER — Encounter (HOSPITAL_COMMUNITY): Payer: Self-pay

## 2016-12-31 ENCOUNTER — Emergency Department (HOSPITAL_COMMUNITY): Payer: Non-veteran care

## 2016-12-31 ENCOUNTER — Emergency Department (HOSPITAL_COMMUNITY)
Admission: EM | Admit: 2016-12-31 | Discharge: 2016-12-31 | Disposition: A | Discharge status: Home / Self Care | Payer: Non-veteran care | Attending: Emergency Medicine | Admitting: Emergency Medicine

## 2016-12-31 DIAGNOSIS — R51 Headache: Secondary | ICD-10-CM | POA: Insufficient documentation

## 2016-12-31 DIAGNOSIS — R519 Headache, unspecified: Secondary | ICD-10-CM

## 2016-12-31 LAB — CBC WITH DIFFERENTIAL/PLATELET
BASOS ABS: 0 10*3/uL (ref 0.0–0.1)
Basophils Relative: 0 %
Eosinophils Absolute: 0.1 10*3/uL (ref 0.0–0.7)
Eosinophils Relative: 1 %
HEMATOCRIT: 38.5 % (ref 36.0–46.0)
HEMOGLOBIN: 12.7 g/dL (ref 12.0–15.0)
LYMPHS PCT: 40 %
Lymphs Abs: 2.9 10*3/uL (ref 0.7–4.0)
MCH: 31.6 pg (ref 26.0–34.0)
MCHC: 33 g/dL (ref 30.0–36.0)
MCV: 95.8 fL (ref 78.0–100.0)
Monocytes Absolute: 0.3 10*3/uL (ref 0.1–1.0)
Monocytes Relative: 5 %
NEUTROS ABS: 3.9 10*3/uL (ref 1.7–7.7)
NEUTROS PCT: 54 %
Platelets: 230 10*3/uL (ref 150–400)
RBC: 4.02 MIL/uL (ref 3.87–5.11)
RDW: 11.9 % (ref 11.5–15.5)
WBC: 7.3 10*3/uL (ref 4.0–10.5)

## 2016-12-31 LAB — BASIC METABOLIC PANEL
ANION GAP: 6 (ref 5–15)
BUN: 15 mg/dL (ref 6–20)
CALCIUM: 8.7 mg/dL — AB (ref 8.9–10.3)
CO2: 23 mmol/L (ref 22–32)
Chloride: 102 mmol/L (ref 101–111)
Creatinine, Ser: 0.93 mg/dL (ref 0.44–1.00)
GFR calc non Af Amer: >60 mL/min (ref >60)
GLUCOSE: 93 mg/dL (ref 65–99)
POTASSIUM: 3.7 mmol/L (ref 3.5–5.1)
Sodium: 131 mmol/L — ABNORMAL LOW (ref 135–145)

## 2016-12-31 MED ORDER — DIPHENHYDRAMINE HCL 50 MG/ML IJ SOLN
25.0000 mg | Freq: Once | INTRAMUSCULAR | Status: AC
  Administered 2016-12-31: 25 mg via INTRAVENOUS
  Filled 2016-12-31: qty 1

## 2016-12-31 MED ORDER — GADOBENATE DIMEGLUMINE 529 MG/ML IV SOLN
15.0000 mL | Freq: Once | INTRAVENOUS | Status: AC
  Administered 2016-12-31: 15 mL via INTRAVENOUS

## 2016-12-31 MED ORDER — PROCHLORPERAZINE EDISYLATE 5 MG/ML IJ SOLN
10.0000 mg | Freq: Once | INTRAMUSCULAR | Status: AC
  Administered 2016-12-31: 10 mg via INTRAVENOUS
  Filled 2016-12-31: qty 2

## 2016-12-31 MED ORDER — DEXAMETHASONE SODIUM PHOSPHATE 10 MG/ML IJ SOLN
10.0000 mg | Freq: Once | INTRAMUSCULAR | Status: AC
  Administered 2016-12-31: 10 mg via INTRAVENOUS
  Filled 2016-12-31: qty 1

## 2016-12-31 MED ORDER — LIDOCAINE HCL (PF) 1 % IJ SOLN
30.0000 mL | Freq: Once | INTRAMUSCULAR | Status: AC
  Administered 2016-12-31: 30 mL

## 2016-12-31 NOTE — ED Notes (Signed)
EDP at bedside  

## 2016-12-31 NOTE — ED Notes (Signed)
Checked with provider and OK for patient to be up after LP

## 2016-12-31 NOTE — ED Triage Notes (Signed)
Onset left sided headache, improves with laying down, visual changes.  [facility] on left [facility] nerve.

## 2016-12-31 NOTE — ED Provider Notes (Signed)
   Procedure - Lumbar Puncture Indication - assessments of intercranial pressure  Anesthesia - local 1% lidocaine wo  Informed consent was obtained from the patient. The area was prepped and draped in the usual sterile fashion. Lidocaine was injected into the surrounding soft tissue. Using landmarks, a 22 guage spinal needle was inserted in the L3-L4 innerspace. The stylet was removed which produced clear fluid. Opening pressure of 14.   The patient tolerated the procedure well. There was no blood loss or hematoma.   Eyvonne MechanicHedges, Louretta Tantillo, PA-C 12/31/16 [phone]

## 2016-12-31 NOTE — ED Notes (Signed)
Pt returned from MRI °

## 2016-12-31 NOTE — ED Provider Notes (Signed)
[facility] EMERGENCY DEPARTMENT Provider Note  CSN: [id number] Arrival date & time: 12/31/16 1620  Chief Complaint(s) Headache  HPI ROMONDA PARKER is a 41 y.o. female   The history is provided by the patient.  Headache   This is a new problem. Episode onset: 2 weeks. The problem occurs constantly. The problem has not changed (fluctuating) since onset.The headache is associated with an unknown factor. The pain is located in the left unilateral, occipital and parietal region. The quality of the pain is described as dull. The pain is moderate. The pain does not radiate. Pertinent negatives include no anorexia, no fever, no malaise/fatigue, no chest pressure, no near-syncope, no orthopnea, no palpitations, no syncope, no shortness of breath, no nausea and no vomiting. Associated symptoms comments: Patient is endorsing blurry vision. She has tried NSAIDs for the symptoms. The treatment provided mild relief.  Symptoms to completely improve with positional changes.  She states that her headache improves with lying down and worsens with standing.  Patient was evaluated by the optometrist who noted left optic nerve edema and [facility] who [facility] noncontrasted brain MRI which was unremarkable.  Past Medical History Past Medical History:  Diagnosis Date  . Abnormal glandular Papanicolaou smear of cervix   . Anxiety   . Bipolar disorder, unspecified (HCC)   . History of colposcopy with cervical biopsy   . 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 Irritable bowel syndrome   . Narcolepsy without cataplexy(347.00)   . Status post bunionectomy   . Thyroiditis, unspecified    Patient Active Problem List   Diagnosis Date Noted  . Major depressive disorder, single episode 05/07/2012  . Narcolepsy without cataplexy(347.00) 10/20/2010  . 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 BIPOLAR AFFECTIVE DISORDER 04/27/2006  . ANXIETY 04/27/2006    . ABNORMAL PAP SMEAR 04/27/2006   Home Medication(s) Prior to Admission medications   Medication Sig Start Date End Date Taking? Authorizing Provider  hydrOXYzine (ATARAX/VISTARIL) 25 MG tablet Take 25 mg by mouth as needed.   Yes [provider]  ARIPiprazole (ABILIFY) 10 MG tablet Take 1 tablet (10 mg total) by mouth at bedtime. Patient not taking: Reported on 12/31/2016 05/07/12   Charm Rings, NP  modafinil (PROVIGIL) 200 MG tablet Take 1 tablet (200 mg total) by mouth 2 (two) times daily. Patient not taking: Reported on 12/31/2016 05/07/12   Charm Rings, NP  traZODone (DESYREL) 150 MG tablet Take 1 tablet (150 mg total) by mouth at bedtime. Patient not taking: Reported on 12/31/2016 05/07/12   Charm Rings, NP                                                                                                                                    Past Surgical History Past Surgical History:  Procedure Laterality Date  . BREAST ENHANCEMENT SURGERY  2011  .  BUNIONECTOMY  1998 % 1999   x 2    Family History History reviewed. No pertinent family history.  Social History Social History  Substance Use Topics  . Smoking status: Never Smoker  . Smokeless tobacco: Never Used  . Alcohol use No     Comment: denied drugs/alcohol/tobacco   Allergies Codeine phosphate  Review of Systems Review of Systems  Constitutional: Negative for fever and malaise/fatigue.  Respiratory: Negative for shortness of breath.   Cardiovascular: Negative for palpitations, orthopnea, syncope and near-syncope.  Gastrointestinal: Negative for anorexia, nausea and vomiting.  Neurological: Positive for headaches.   All other systems are reviewed and are negative for acute change except as noted in the HPI  Physical Exam Vital Signs  I have reviewed the triage vital signs BP 100/73   Pulse 70   Temp (!) 97.5 F (36.4 C) (Oral)   Resp 20   LMP 12/25/2016   SpO2 98%   Physical Exam   Constitutional: She is oriented to person, place, and time. She appears well-developed and well-nourished. No distress.  HENT:  Head: Normocephalic and atraumatic.  Nose: Nose normal.  Eyes: Conjunctivae and EOM are normal. Right eye exhibits no discharge. Left eye exhibits no discharge. No scleral icterus.  Fundoscopic exam:      The right eye shows no papilledema.       The left eye shows papilledema (possible).  Neck: Normal range of motion. Neck supple.  Cardiovascular: Normal rate and regular rhythm.  Exam reveals no gallop and no friction rub.   No murmur heard. Pulmonary/Chest: Effort normal and breath sounds normal. No stridor. No respiratory distress. She has no rales.  Abdominal: Soft. She exhibits no distension. There is no tenderness.  Musculoskeletal: She exhibits no edema or tenderness.  Neurological: She is alert and oriented to person, place, and time.  Mental Status:  Alert and oriented to person, place, and time.  Attention and concentration normal.  Speech clear.  Recent memory is intact  Cranial Nerves:  II Visual Fields: Intact to confrontation. Visual fields intact. III, IV, VI: Pupils equal and reactive to light and near. Full eye movement without nystagmus  V Facial Sensation: Normal. No weakness of masticatory muscles  VII: No facial weakness or asymmetry  VIII Auditory Acuity: Grossly normal  IX/X: The uvula is midline; the palate elevates symmetrically  XI: Normal sternocleidomastoid and trapezius strength  XII: The tongue is midline. No atrophy or fasciculations.   Motor System: Muscle Strength: 5/5 and symmetric in the upper and lower extremities. No pronation or drift.  Muscle Tone: Tone and muscle bulk are normal in the upper and lower extremities.   Reflexes: DTRs: 1+ and symmetrical in all four extremities. No Clonus Coordination: Intact finger-to-nose, heel-to-shin. No tremor.  Sensation: Intact to light touch, and pinprick. Negative Romberg  test.  Gait: Routine gait normal.   Skin: Skin is warm and dry. No rash noted. She is not diaphoretic. No erythema.  Psychiatric: She has a normal mood and affect.  Vitals reviewed.   ED Results and Treatments Labs (all labs ordered are listed, but only abnormal results are displayed) Labs Reviewed  BASIC METABOLIC PANEL - Abnormal; Notable for the following:       Result Value   Sodium 131 (*)    Calcium 8.7 (*)    All other components within normal limits  CBC WITH DIFFERENTIAL/PLATELET  EKG  EKG Interpretation  Date/Time:    Ventricular Rate:    PR Interval:    QRS Duration:   QT Interval:    QTC Calculation:   R Axis:     Text Interpretation:        Radiology Mr Laqueta Jean And Wo Contrast  Result Date: 12/31/2016 CLINICAL DATA:  Visual disturbances, confusion, head pressure for 2 weeks. Headache, focal deficit or papilledema. EXAM: MRI HEAD WITHOUT AND WITH CONTRAST MRV HEAD WITHOUT CONTRAST TECHNIQUE: Multiplanar, multiecho pulse sequences of the brain and surrounding structures were obtained without and with intravenous contrast. Angiographic images of the intracranial venous structures were obtained using MRV technique without intravenous contrast. CONTRAST:  MULTIHANCE GADOBENATE DIMEGLUMINE 529 MG/ML IV SOLN COMPARISON:  CT head without contrast 07/10/2006. FINDINGS: The diffusion-weighted images demonstrate no evidence for acute or subacute infarction. No acute hemorrhage or mass lesion is present. There is no significant white matter disease. The ventricles are of normal size. The internal auditory canals are within normal limits. The brainstem and cerebellum are normal. No significant extraaxial fluid collection is present. Flow is present in the major intracranial arteries. The skullbase is within normal limits. The craniocervical junction is normal.  Midline sagittal structures are unremarkable. The paranasal sinuses and mastoid air cells are clear. The globes and orbits are intact. Postcontrast images demonstrate no pathologic enhancement. MR venogram demonstrates normal patency of the dural sinuses. The left transverse sinus is dominant. Straight sinus and deep cerebral veins are intact. Cortical veins are unremarkable. IMPRESSION: 1. Negative MRI the brain without and with contrast. 2. Normal MR venogram without evidence for significant stenosis or obstruction. Electronically Signed   By: Marin Roberts M.D.   On: 12/31/2016 21:17   Mr Mrv Head Wo Cm  Result Date: 12/31/2016 CLINICAL DATA:  Visual disturbances, confusion, head pressure for 2 weeks. Headache, focal deficit or papilledema. EXAM: MRI HEAD WITHOUT AND WITH CONTRAST MRV HEAD WITHOUT CONTRAST TECHNIQUE: Multiplanar, multiecho pulse sequences of the brain and surrounding structures were obtained without and with intravenous contrast. Angiographic images of the intracranial venous structures were obtained using MRV technique without intravenous contrast. CONTRAST:  MULTIHANCE GADOBENATE DIMEGLUMINE 529 MG/ML IV SOLN COMPARISON:  CT head without contrast 07/10/2006. FINDINGS: The diffusion-weighted images demonstrate no evidence for acute or subacute infarction. No acute hemorrhage or mass lesion is present. There is no significant white matter disease. The ventricles are of normal size. The internal auditory canals are within normal limits. The brainstem and cerebellum are normal. No significant extraaxial fluid collection is present. Flow is present in the major intracranial arteries. The skullbase is within normal limits. The craniocervical junction is normal. Midline sagittal structures are unremarkable. The paranasal sinuses and mastoid air cells are clear. The globes and orbits are intact. Postcontrast images demonstrate no pathologic enhancement. MR venogram demonstrates normal  patency of the dural sinuses. The left transverse sinus is dominant. Straight sinus and deep cerebral veins are intact. Cortical veins are unremarkable. IMPRESSION: 1. Negative MRI the brain without and with contrast. 2. Normal MR venogram without evidence for significant stenosis or obstruction. Electronically Signed   By: Marin Roberts M.D.   On: 12/31/2016 21:17   Pertinent labs & imaging results that were available during my care of the patient were reviewed by me and considered in my medical decision making (see chart for details).  Medications Ordered in ED Medications  diphenhydrAMINE (BENADRYL) injection 25 mg (25 mg Intravenous Given 12/31/16 1929)  dexamethasone (DECADRON) injection 10 mg (  10 mg Intravenous Given 12/31/16 1928)  prochlorperazine (COMPAZINE) injection 10 mg (10 mg Intravenous Given 12/31/16 1928)  lidocaine (PF) (XYLOCAINE) 1 % injection 30 mL (30 mLs Infiltration Given 12/31/16 1928)  gadobenate dimeglumine (MULTIHANCE) injection 15 mL (15 mLs Intravenous Contrast Given 12/31/16 2051)                                                                                                                                    Procedures Procedures  (including critical care time)  Medical Decision Making / ED Course I have reviewed the nursing notes for this encounter and the patient's prior records (if available in EHR or on provided paperwork).    Given the new onset headache with questionable papilledema and an headache that is positional, IIH was considered.  Also given the pressure behind the eye new onset multiple sclerosis was also considered.  Also considered cavernous sinus thrombosis.  MRI negative.  MRV negative.  LP with normal CSF pressures.  Patient was not having any infectious symptoms of meningitis unlikely.  Presentation not suspicious for subarachnoid hemorrhage.  Patient given migraine cocktail resulting in significant improvement in her symptomatology.   Recommended close follow-up with her neurologist which is already scheduled through the [place].  The patient is safe for discharge with strict return precautions.   Final Clinical Impression(s) / ED Diagnoses Final diagnoses:  Bad headache   Disposition: Discharge  Condition: Good  I have discussed the results, Dx and Tx plan with the patient who expressed understanding and agree(s) with the plan. Discharge instructions discussed at great length. The patient was given strict return precautions who verbalized understanding of the instructions. No further questions at time of discharge.    New Prescriptions   No medications on file    Follow Up: Excell Seltzer, MD [address] [place] [address] [phone]     Neurology   As scheduled      This chart was dictated using voice recognition software.  Despite best efforts to proofread,  errors can occur which can change the documentation meaning.   Nira Conn, MD 12/31/16 2210

## 2016-12-31 NOTE — ED Notes (Signed)
MRI notified of patient being ready for scan.

## 2017-05-26 ENCOUNTER — Ambulatory Visit (INDEPENDENT_AMBULATORY_CARE_PROVIDER_SITE_OTHER): Payer: Non-veteran care | Admitting: Pulmonary Disease

## 2017-05-26 ENCOUNTER — Encounter: Payer: Self-pay | Admitting: Pulmonary Disease

## 2017-05-26 VITALS — BP 118/64 | HR 64 | Ht 62.0 in | Wt 153.4 lb

## 2017-05-26 DIAGNOSIS — G47411 Narcolepsy with cataplexy: Secondary | ICD-10-CM | POA: Diagnosis not present

## 2017-05-26 DIAGNOSIS — G4751 Confusional arousals: Secondary | ICD-10-CM

## 2017-05-26 DIAGNOSIS — F4310 Post-traumatic stress disorder, unspecified: Secondary | ICD-10-CM

## 2017-05-26 DIAGNOSIS — R29818 Other symptoms and signs involving the nervous system: Secondary | ICD-10-CM
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
## 2017-05-26 NOTE — Patient Instructions (Signed)
Will arrange for in lab sleep study Will call to arrange for follow up after sleep study reviewed  

## 2017-05-26 NOTE — Progress Notes (Signed)
   Subjective:    Patient ID: Susan Barron, female    DOB: 1975-10-29, 42 y.o.   MRN: [id number]  HPI    Review of Systems  Constitutional: Negative for fever and unexpected weight change.  HENT: Negative for congestion, dental problem, ear pain, nosebleeds, postnasal drip, rhinorrhea, sinus pressure, sneezing, sore throat and trouble swallowing.   Eyes: Negative for redness and itching.  Respiratory: Negative for cough, chest tightness, shortness of breath and wheezing.   Cardiovascular: Negative for palpitations and leg swelling.  Gastrointestinal: Negative for nausea and vomiting.  Genitourinary: Negative for dysuria.  Musculoskeletal: Negative for joint swelling.  Skin: Negative for rash.  Allergic/Immunologic: Negative.  Negative for environmental allergies, food allergies and immunocompromised state.  Neurological: Positive for headaches.  Hematological: Does not bruise/bleed easily.  Psychiatric/Behavioral: Negative for dysphoric mood. The patient is not nervous/anxious.        Objective:   Physical Exam        Assessment & Plan:

## 2017-06-19 ENCOUNTER — Ambulatory Visit (HOSPITAL_BASED_OUTPATIENT_CLINIC_OR_DEPARTMENT_OTHER): Payer: Non-veteran care | Attending: Pulmonary Disease | Admitting: Pulmonary Disease

## 2017-06-19 VITALS — Ht 62.0 in | Wt 146.0 lb

## 2017-06-19 DIAGNOSIS — G47411 Narcolepsy with cataplexy: Secondary | ICD-10-CM | POA: Insufficient documentation

## 2017-06-19 DIAGNOSIS — R29818 Other symptoms and signs involving the nervous system: Secondary | ICD-10-CM

## 2017-06-19 DIAGNOSIS — R5383 Other fatigue: Secondary | ICD-10-CM | POA: Insufficient documentation

## 2017-06-19 DIAGNOSIS — R51 Headache: Secondary | ICD-10-CM | POA: Insufficient documentation

## 2017-06-19 DIAGNOSIS — F329 Major depressive disorder, single episode, unspecified: Secondary | ICD-10-CM | POA: Diagnosis not present

## 2017-06-19 DIAGNOSIS — R0683 Snoring: Secondary | ICD-10-CM | POA: Insufficient documentation

## 2017-06-28 ENCOUNTER — Telehealth: Payer: Self-pay | Admitting: Pulmonary Disease

## 2017-06-28 DIAGNOSIS — G47411 Narcolepsy with cataplexy: Secondary | ICD-10-CM | POA: Diagnosis not present

## 2017-06-28 NOTE — Telephone Encounter (Signed)
ATC patient. VM was not set up therefore I could not leave a message. Will attempt to call back later.

## 2017-06-28 NOTE — Telephone Encounter (Signed)
PSG 06/19/17 >> AHI 2, RDI 12, SpO2 low 91%.   Please let her know that her sleep study did not show significant sleep apnea.  Please schedule ROV with me to discuss in more detail about management for history of narcolepsy.

## 2017-06-28 NOTE — Procedures (Signed)
    Patient Name: Susan Barron, Susan Barron Date: 06/19/2017 Gender: Female D.O.B: 1975/10/08 Age (years): 61 Referring Provider: Coralyn Helling MD, ABSM Height (inches): 62 Interpreting Physician: Coralyn Helling MD, ABSM Weight (lbs): 146 RPSGT: Shelah Lewandowsky BMI: 27 MRN: [id number] Neck Size: 15.00  CLINICAL INFORMATION Sleep Study Type: NPSG  Indication for sleep study: Daytime Fatigue, Depression, Morning Headaches, Parasomnias, Re-Evaluation  Epworth Sleepiness Score: 16  SLEEP STUDY TECHNIQUE As per the AASM Manual for the Scoring of Sleep and Associated Events v2.3 (April 2016) with a hypopnea requiring 4% desaturations.  The channels recorded and monitored were frontal, central and occipital EEG, electrooculogram (EOG), submentalis EMG (chin), nasal and oral airflow, thoracic and abdominal wall motion, anterior tibialis EMG, snore microphone, electrocardiogram, and pulse oximetry.  MEDICATIONS Medications self-administered by patient taken the night of the study : N/A  SLEEP ARCHITECTURE The study was initiated at 10:30:21 PM and ended at 5:13:04 AM.  Sleep onset time was 6.0 minutes and the sleep efficiency was 89.5%%. The total sleep time was 360.2 minutes.  Stage REM latency was 78.0 minutes.  The patient spent 10.7%% of the night in stage N1 sleep, 62.5%% in stage N2 sleep, 0.0%% in stage N3 and 26.79% in REM.  Alpha intrusion was absent.  Supine sleep was 36.50%.  RESPIRATORY PARAMETERS The overall apnea/hypopnea index (AHI) was 2.0 per hour. There were 6 total apneas, including 2 obstructive, 4 central and 0 mixed apneas. There were 6 hypopneas and 60 RERAs.  The AHI during Stage REM sleep was 2.5 per hour.  AHI while supine was 3.7 per hour.  The mean oxygen saturation was 95.8%. The minimum SpO2 during sleep was 91.0%.  snoring was noted during this study.  CARDIAC DATA The 2 lead EKG demonstrated sinus rhythm. The mean heart rate was 67.8 beats per  minute. Other EKG findings include: None.  LEG MOVEMENT DATA The total PLMS were 0 with a resulting PLMS index of 0.0. Associated arousal with leg movement index was 0.3 .  IMPRESSIONS - While she had a few respiratory events, these were not frequent enough to qualify for a diagnosis of obstructive sleep apnea.  Her overall AHI was 2 with an SpO2 low of 91%.  DIAGNOSIS - Snoring.  RECOMMENDATIONS - Return to sleep clinic for further assessment.  [Electronically signed] 06/28/2017 08:38 AM  Coralyn Helling MD, ABSM Diplomate, American Board of Sleep Medicine NPI: [id number]

## 2017-06-29 NOTE — Telephone Encounter (Signed)
Called Patient and gave her results and recommendations.  Patient appt made for 08/09/17 at 2pm. Nothing further  needed at this time.

## 2017-08-09 ENCOUNTER — Encounter: Payer: Self-pay | Admitting: Pulmonary Disease

## 2017-08-09 ENCOUNTER — Ambulatory Visit (INDEPENDENT_AMBULATORY_CARE_PROVIDER_SITE_OTHER): Payer: Non-veteran care | Admitting: Pulmonary Disease

## 2017-08-09 VITALS — BP 100/68 | HR 72 | Ht 62.0 in | Wt 141.2 lb

## 2017-08-09 DIAGNOSIS — G47411 Narcolepsy with cataplexy: Secondary | ICD-10-CM | POA: Diagnosis not present

## 2017-08-09 MED ORDER — ARMODAFINIL 150 MG PO TABS: 150.0000 mg | ORAL_TABLET | Freq: Every day | ORAL | 5 refills | Status: DC

## 2017-08-09 NOTE — Progress Notes (Signed)
[facility]  Chief Complaint  Patient presents with  . Follow-up    Pt is having more confusion at night during sleep, and more daytime fatigue, low blood pressure concerns during the day.    Vital signs: BP 100/68 (BP Location: Left Arm, Cuff Size: Normal)   Pulse 72   Ht 5\' 2"  (1.575 m)   Wt 141 lb 3.2 oz (64 kg)   SpO2 99%   BMI 25.83 kg/m   History of Present Illness: Susan Barron is a 42 y.o. 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  She had sleep study.  No significant sleep apnea or other findings.  Still has daytime sleepiness, sleep hallucinations, sleep paralysis.  Cataplexy episodes infrequent.   Physical Exam:  General - pleasant Eyes - pupils reactive ENT - no sinus tenderness, no oral exudate, no LAN Cardiac - regular, no murmur Chest - no wheeze, rales Abd - soft, non tender Ext - no edema Skin - no rashes Neuro - normal strength Psych - normal mood   Assessment/plan:  Narcolepsy with cataplexy. - schedule naps as able - regular sleep/wake schedule - add armodafinil >> [facility]; discussed dosing schedule, side effects, and impact on oral contraceptives if used   Patient Instructions  Armodafinil (nuvigil) one pill first thing in morning  Follow up in 2 months     Coralyn HellingVineet Delorse Shane, MD [facility] 08/09/2017, 2:47 PM Pager:  [phone]  Flow Sheet  Sleep tests: PSG 10/12/10 >> AHI 1.2 MSLT 10/12/10 >> Sleep latency 0.7 min, 2/5 SOREMS PSG 06/19/17 >> AHI 2, RDI 12, SpO2 low 91%.  Past Medical History: She  has a past medical history of Abnormal glandular Papanicolaou smear of cervix, Anxiety, Bipolar disorder, unspecified (HCC), History of colposcopy with cervical biopsy, Human papillomavirus in conditions classified elsewhere and of unspecified site, Irritable bowel syndrome, Narcolepsy without cataplexy(347.00), Status post bunionectomy, and Thyroiditis,  unspecified.  Past Surgical History: She  has a past surgical history that includes Bunionectomy (1998 % 1999) and Breast enhancement surgery (2011).  Family History: Her family history is not on file.  Social History: She  reports that she has never smoked. She has never used smokeless tobacco. She reports that she does not drink alcohol or use drugs.  Medications: Allergies as of 08/09/2017      Reactions   Codeine Phosphate    REACTION: urticaria (hives)      Medication List        Accurate as of 08/09/17  2:47 PM. Always use your most recent med list.          Armodafinil 150 MG tablet Take 1 tablet (150 mg total) by mouth daily.   docusate sodium 100 MG capsule Commonly known as:  COLACE Take 100 mg by mouth 2 (two) times daily.   meloxicam 7.5 MG tablet Commonly known as:  MOBIC Take 7.5 mg by mouth daily.

## 2017-08-09 NOTE — Patient Instructions (Signed)
Armodafinil (nuvigil) one pill first thing in morning  Follow up in 2 months

## 2017-09-05 ENCOUNTER — Other Ambulatory Visit: Payer: Self-pay | Admitting: Pulmonary Disease

## 2017-09-05 MED ORDER — MODAFINIL 100 MG PO TABS: 100.0000 mg | ORAL_TABLET | Freq: Every day | ORAL | 5 refills | Status: DC

## 2017-09-05 NOTE — Progress Notes (Signed)
[facility]  Will change to modafinil 100 mg daily.  [facility].

## 2017-09-07 ENCOUNTER — Telehealth: Payer: Self-pay | Admitting: Pulmonary Disease

## 2017-09-07 ENCOUNTER — Other Ambulatory Visit: Payer: Self-pay | Admitting: Pulmonary Disease

## 2017-09-07 MED ORDER — MODAFINIL 100 MG PO TABS: 100.0000 mg | ORAL_TABLET | Freq: Every day | ORAL | 5 refills | Status: DC

## 2017-09-07 MED ORDER — ARMODAFINIL 150 MG PO TABS: 150.0000 mg | ORAL_TABLET | Freq: Every day | ORAL | 5 refills | Status: DC

## 2017-09-07 NOTE — Telephone Encounter (Signed)
I have printed script for armodafanil 150 mg.

## 2017-09-07 NOTE — Telephone Encounter (Signed)
[facility] called [facility] and spoke with Shanda BumpsJessica. The wrong prescription was sent in. They need a prescription for Modafinil not Armodafinil.  VS - are you okay with us printing a new prescription?

## 2017-09-07 NOTE — Telephone Encounter (Signed)
Susan LaymanLindsey spoke with VS over the phone, he is fine with giving patient a prescription of provigil and having Dr. Maple HudsonYoung sign in his place.   Prescription has been printed and sign by CY. [facility]. Susan BoomDaniel has been contacted and made aware of this. Nothing further needed.

## 2017-09-07 NOTE — Telephone Encounter (Signed)
Routing to [place] so that prescription can be faxed from the [facility].

## 2017-09-07 NOTE — Telephone Encounter (Signed)
Per Harvin HazelKelli - in the 7.9.19 orders only Dr Craige CottaSood changed patient's armodafinil to modafinil 100mg  due to patient's insurance formulary.  This was [facility] to the [facility] [place].  Dr Craige CottaSood attempted to contact the patient but was unsuccessful.  Called spoke with patient who was very angry that she was not communicated with regarding this change in her medications and that the process was initiated on 6/27.  Apologized to patient for this and any inconvenience caused.  Did inform patient that Dr Craige CottaSood was not in the office at the time the PA was initially sent to the office and has only returned to the office this week.  Patient adamant that this be taken care of TODAY as her narcolepsy is "a serious condition and detrimental to her health."  Patient stated we need to call the [facility] @ [phone] and speak with Reuel Boomaniel in the pharmacy to correct the situation.  She would like a call back once situation is rectified - may leave detailed message.  Called spoke with [facility]iel [facility].  Per Reuel Boomaniel, Rx can be faxed to [phone] Alysia Pennaattn Tasha and patient's modafinil can be overnighted to her.  Reuel BoomDaniel will call me back in triage once the Rx is received and ready to ship.  Routing to [facility] and Dr Craige CottaSood in [facility] office so that Rx can be reprinted, signed and faxed to the aforementioned number.  Please route back to triage once complete, thank you.

## 2017-09-07 NOTE — Telephone Encounter (Signed)
rec'd RX for Armodafinil 150mg  once daily from VS signed and printed today. Fax the RX to Alysia PennaAttn Tasha at fax [phone] Called spoke with Molly Maduroobert at [facility] [facility] at phone [phone] Advised to see if they rec'd the fax for the RX for Armodafinil 150mg  for pt. Molly MaduroRobert advised the fax was rec'd and the patient will need to call into the pharmacy for additional f/u to refill rx.  Attempted to call patient today regarding the rx has been faxed today. I did not receive an answer at time of call. I have left a voicemail message for pt to return call. X1  Routing message to triage for review.

## 2017-09-07 NOTE — Telephone Encounter (Signed)
Called and spoke with patient, she states that [facility] advised her to call triage to speak with a nurse but she was not sure why. Advised patient that we had already sent everything in and nothing further was needed. Patient advised understanding. Nothing further needed.

## 2017-09-15 ENCOUNTER — Telehealth: Payer: Self-pay | Admitting: Pulmonary Disease

## 2017-09-15 NOTE — Telephone Encounter (Signed)
Called and spoke with patient, she states that she has been very nauseated, drainage and slight headache. She thinks this is coming from the provigil. She is wanting to know what she can do.    VS please advise.

## 2017-09-18 NOTE — Telephone Encounter (Signed)
She can try taking 1/2 a pill daily and see if this is better tolerated.

## 2017-09-18 NOTE — Telephone Encounter (Signed)
Attempted to call patient today regarding VS recommendations below. I did not receive an answer at time of call. I have left a voicemail message for pt to return call. X1  

## 2017-09-18 NOTE — Telephone Encounter (Signed)
Please let her know that we will give her time to adjust to medication and then can titrate dose up as needed to help with residual daytime sleepiness.

## 2017-09-18 NOTE — Telephone Encounter (Signed)
Pt is aware of below message and voiced her understanding.  Pt stated that she took a half tablet for 2 days and tolerated better. Pt stated yesterday and today she has taken a whole dose without side affects.  Pt also stated she is still having trouble with sleepiness while driving long distance.  VS please advise. Thanks

## 2017-09-20 ENCOUNTER — Telehealth: Payer: Self-pay | Admitting: Pulmonary Disease

## 2017-09-20 NOTE — Telephone Encounter (Signed)
Attempted to call patient today regarding call back. Left message for Judeth CornfieldStephanie with VA at phone [phone] ext [phone] I did not receive an answer at time of call. I have left a voicemail message for pt to return call. X1

## 2017-09-20 NOTE — Telephone Encounter (Signed)
Attempted to call patient today regarding VS recommendations about new rx for modafinil 100mg . I did not receive an answer at time of call. I have left a voicemail message for pt to return call. X1

## 2017-09-21 NOTE — Telephone Encounter (Signed)
Attempted to call patient today regarding VS recommendations about new rx for modafinil 100mg . I did not receive an answer at time of call. I have left a voicemail message forptto return call. X3 Mailed letter to pt in mail to contact our office regarding rx and VS recommendations Nothing further needed at this time.

## 2017-09-21 NOTE — Telephone Encounter (Signed)
Called and spoke with LinwoodStephanie with VA at phone [phone] ext [phone] She transferred me to correct person, needing to speak with Shanda BumpsJessica ext [phone] in [facility] Attempted to call Shanda BumpsJessica at this ext, no answer at this time of call, LVM today. kmw

## 2017-09-25 NOTE — Telephone Encounter (Signed)
LVM with Shanda BumpsJessica in [facility] Shanda BumpsJessica is needing additional paperwork/documents from our office. Trying to find exactly what is needed at this time. Will f/u later this week.

## 2017-09-25 NOTE — Telephone Encounter (Signed)
[facility] from [facility]  [phone] Ext# [phone]  needs medical records from 05/26/17, 08/09/17 Fax#[phone]

## 2017-09-25 NOTE — Telephone Encounter (Signed)
Pt's OV notes from the requested visits have been faxed in attention Kim to the [facility] to the fax number listed.  Confirmation was received that the fax went through. Nothing further needed.

## 2017-09-26 ENCOUNTER — Telehealth: Payer: Self-pay | Admitting: Pulmonary Disease

## 2017-09-26 NOTE — Telephone Encounter (Signed)
Pls disregard.

## 2017-10-10 ENCOUNTER — Ambulatory Visit: Payer: Self-pay | Admitting: Pulmonary Disease

## 2017-10-20 ENCOUNTER — Ambulatory Visit (INDEPENDENT_AMBULATORY_CARE_PROVIDER_SITE_OTHER): Payer: Non-veteran care | Admitting: Pulmonary Disease

## 2017-10-20 ENCOUNTER — Encounter: Payer: Self-pay | Admitting: Pulmonary Disease

## 2017-10-20 ENCOUNTER — Telehealth: Payer: Self-pay | Admitting: Pulmonary Disease

## 2017-10-20 VITALS — BP 120/68 | HR 88 | Ht 62.0 in | Wt 144.0 lb

## 2017-10-20 DIAGNOSIS — G47411 Narcolepsy with cataplexy: Secondary | ICD-10-CM

## 2017-10-20 MED ORDER — SERTRALINE HCL 50 MG PO TABS: 50.0000 mg | ORAL_TABLET | Freq: Every day | ORAL | 3 refills | Status: DC

## 2017-10-20 MED ORDER — MODAFINIL 100 MG PO TABS: 150.0000 mg | ORAL_TABLET | Freq: Every day | ORAL | 3 refills | Status: DC

## 2017-10-20 NOTE — Telephone Encounter (Signed)
Called patient, unable to reach left message to give us a call back. 

## 2017-10-20 NOTE — Progress Notes (Signed)
[facility]  Chief Complaint  Patient presents with  . Follow-up    2 month follow up for narcolepsy. Patient states she is still having some daytime sleepiness. She has also started back to stress eating at night.     Constitutional: BP 120/68 (BP Location: Left Arm, Patient Position: Sitting, Cuff Size: Normal)   Pulse 88   Ht 5\' 2"  (1.575 m)   Wt 144 lb (65.3 kg)   SpO2 99%   BMI 26.34 kg/m   History of Present Illness: Susan Barron is a 42 y.o. 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  She feels that modafinil has helped some.  She has decreased appetite.  Occasional nausea.  Not having headaches.  Still needs to nap several hours per day.  Has more frequent episodes of cataplexy.  Still getting episodes of sleep hallucinations and sleep paralysis.  Assessment/plan:  Narcolepsy with cataplexy. - she [facility] - will increase modafinil to 150 mg daily - discussed newer medications for narcolepsy if modafinil doesn't work - will add zoloft 50 mg daily and titrate up as needed - schedule naps as able - regular sleep/wake schedule  Time spent 18 minutes  Patient Instructions  Change modafinil to 150 mg daily Zoloft 50 mg daily  Follow up in 4 months     Coralyn HellingVineet Braiden Rodman, MD [facility] 10/20/2017, 12:41 PM Pager:  [phone]  Flow Sheet  Sleep tests: PSG 10/12/10 >> AHI 1.2 MSLT 10/12/10 >> Sleep latency 0.7 min, 2/5 SOREMS PSG 06/19/17 >> AHI 2, RDI 12, SpO2 low 91%.  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  Past Surgical History: She   has a past surgical history that includes Bunionectomy (1998 % 1999) and Breast enhancement surgery (2011).  Family History: Her family history is not on file.  Social History: She  reports that she has never smoked. She has never used smokeless tobacco. She reports that she does not drink alcohol or use drugs.  Medications: Allergies as of 10/20/2017      Reactions   Codeine Phosphate    REACTION: urticaria (hives)      Medication List        Accurate as of 10/20/17 12:41 PM. Always use your most recent med list.          docusate sodium 100 MG capsule Commonly known as:  COLACE Take 100 mg by mouth 2 (two) times daily.   modafinil 100 MG tablet Commonly known as:  PROVIGIL Take 1.5 tablets (150 mg total) by mouth daily.   sertraline 50 MG tablet Commonly known as:  ZOLOFT Take 1 tablet (50 mg total) by mouth daily.

## 2017-10-20 NOTE — Patient Instructions (Signed)
Change modafinil to 150 mg daily Zoloft 50 mg daily  Follow up in 4 months

## 2017-10-23 MED ORDER — MODAFINIL 100 MG PO TABS: 150.0000 mg | ORAL_TABLET | Freq: Every day | ORAL | 3 refills | Status: DC

## 2017-10-23 MED ORDER — SERTRALINE HCL 50 MG PO TABS: 50.0000 mg | ORAL_TABLET | Freq: Every day | ORAL | 3 refills | Status: DC

## 2017-10-23 NOTE — Telephone Encounter (Signed)
Spoke with patient. She stated that she needs to have her Zoloft and Provigil faxed [place] [facility] [place]. Advised patient that I would do this today. She verbalized understanding.   Nothing further needed at time of call.

## 2017-10-23 NOTE — Telephone Encounter (Signed)
Pt returning call to nurse a/b script she can be reached @ [phone] pt going into surgery on tomorrow morning and suggested that [facility].Caren GriffinsStanley A Dalton

## 2018-01-01 ENCOUNTER — Telehealth: Payer: Self-pay | Admitting: Pulmonary Disease

## 2018-01-01 NOTE — Telephone Encounter (Signed)
The zoloft and lyrica are for two separate conditions.  My preference is that she continue with zoloft to help with cataplexy in the setting of narcolepsy.

## 2018-01-01 NOTE — Telephone Encounter (Signed)
Called and spoke with patient, she stated that she was placed on zoloft for cataplexy and she was told by her PCP that she was needing to be on lyrica for her fibromyalgia. Patient stated that she does not need to be on both medications.   VS please advise, thank you

## 2018-01-02 NOTE — Telephone Encounter (Signed)
Called patient unable to reach left message to give us a call back.

## 2018-01-03 NOTE — Telephone Encounter (Signed)
Called and spoke to pt. Pt states Dr. Craige Cotta prescribed Zoloft for Cataplexy. Per pt her PCP had mentioned starting Lyrica for fibromyalgia. Pt is wanting to know if Lyrica will help with cataplexy as well, and if so can she switch to this medication?  VS please advise. thanks

## 2018-01-03 NOTE — Telephone Encounter (Signed)
Patient is returning the call. CB is [phone].

## 2018-01-03 NOTE — Telephone Encounter (Signed)
Pt is aware of below message and voiced her understanding. Nothing further is needed. 

## 2018-01-03 NOTE — Telephone Encounter (Signed)
Please let her know that lyrica will not be helpful in treating cataplexy associated with narcolepsy.

## 2018-01-03 NOTE — Telephone Encounter (Signed)
atc unable to reach I left a voicemail.

## 2018-03-05 ENCOUNTER — Ambulatory Visit: Payer: Self-pay | Admitting: Pulmonary Disease

## 2018-04-02 ENCOUNTER — Telehealth: Payer: Self-pay | Admitting: Pulmonary Disease

## 2018-04-02 ENCOUNTER — Ambulatory Visit: Payer: Self-pay | Admitting: Pulmonary Disease

## 2018-04-02 NOTE — Telephone Encounter (Signed)
lmtcb x1 for pt. 

## 2018-04-02 NOTE — Telephone Encounter (Signed)
Spoke to pt. Pt had to reschedule today's appointment, due to [facility] not being received. Pt reports of increased insomnia for the past two weeks. Pt states she is currently being evaluated for MS, which is a common cause of insomnia.  Pt wanted to make VS aware of this.

## 2018-04-03 NOTE — Telephone Encounter (Signed)
Noted.  Might need to decrease dose of modafinil if insomnia symptoms persist.

## 2018-04-04 ENCOUNTER — Encounter: Payer: Self-pay | Admitting: Emergency Medicine

## 2018-04-04 ENCOUNTER — Emergency Department
Admission: EM | Admit: 2018-04-04 | Discharge: 2018-04-04 | Disposition: A | Discharge status: Home / Self Care | Payer: Non-veteran care | Attending: Emergency Medicine | Admitting: Emergency Medicine

## 2018-04-04 ENCOUNTER — Other Ambulatory Visit: Payer: Self-pay

## 2018-04-04 DIAGNOSIS — Z5321 Procedure and treatment not carried out due to patient leaving prior to being seen by health care provider: Secondary | ICD-10-CM | POA: Diagnosis not present

## 2018-04-04 DIAGNOSIS — R531 Weakness: Secondary | ICD-10-CM | POA: Diagnosis present

## 2018-04-04 LAB — URINALYSIS, COMPLETE (UACMP) WITH MICROSCOPIC
Bilirubin Urine: NEGATIVE
GLUCOSE, UA: NEGATIVE mg/dL
HGB URINE DIPSTICK: NEGATIVE
Ketones, ur: NEGATIVE mg/dL
Leukocytes, UA: NEGATIVE
Nitrite: NEGATIVE
Protein, ur: NEGATIVE mg/dL
Specific Gravity, Urine: 1.001 — ABNORMAL LOW (ref 1.005–1.030)
Squamous Epithelial / HPF: NONE SEEN (ref 0–5)
pH: 6 (ref 5.0–8.0)

## 2018-04-04 LAB — BASIC METABOLIC PANEL
Anion gap: 4 — ABNORMAL LOW (ref 5–15)
BUN: 11 mg/dL (ref 6–20)
CHLORIDE: 106 mmol/L (ref 98–111)
CO2: 26 mmol/L (ref 22–32)
CREATININE: 0.85 mg/dL (ref 0.44–1.00)
Calcium: 9.2 mg/dL (ref 8.9–10.3)
Glucose, Bld: 64 mg/dL — ABNORMAL LOW (ref 70–99)
POTASSIUM: 4 mmol/L (ref 3.5–5.1)
Sodium: 136 mmol/L (ref 135–145)

## 2018-04-04 LAB — CBC
HCT: 39.1 % (ref 36.0–46.0)
Hemoglobin: 13 g/dL (ref 12.0–15.0)
MCH: 31.9 pg (ref 26.0–34.0)
MCHC: 33.2 g/dL (ref 30.0–36.0)
MCV: 96.1 fL (ref 80.0–100.0)
Platelets: 242 10*3/uL (ref 150–400)
RBC: 4.07 MIL/uL (ref 3.87–5.11)
RDW: 11.3 % — AB (ref 11.5–15.5)
WBC: 5 10*3/uL (ref 4.0–10.5)
nRBC: 0 % (ref 0.0–0.2)

## 2018-04-04 LAB — TROPONIN I

## 2018-04-04 NOTE — ED Notes (Signed)
Pt states she wants to leave. Pt seen exiting

## 2018-04-04 NOTE — ED Triage Notes (Signed)
Patient to ER for c/o generalized weakness. States she would like to be tested for MS. States she developed "drastic vision changes in 2018", "drastic emotional changes in December", balance issues, tingling in extremities. +Chest pain to right side intermittently for "a couple months".

## 2018-04-04 NOTE — ED Notes (Signed)
Patient states she wants to leave, reassured patient about wait, moved to subwait in recliner.  No new complaints verbalized.  VSS

## 2018-04-04 NOTE — ED Notes (Signed)
Patient expressing desire to

## 2018-04-30 ENCOUNTER — Telehealth: Payer: Self-pay | Admitting: Pulmonary Disease

## 2018-04-30 DIAGNOSIS — G47411 Narcolepsy with cataplexy: Secondary | ICD-10-CM

## 2018-04-30 MED ORDER — MODAFINIL 100 MG PO TABS: 150.0000 mg | ORAL_TABLET | Freq: Every day | ORAL | 0 refills | Status: DC

## 2018-04-30 NOTE — Telephone Encounter (Signed)
Returned call to Susan Barron at [facility], requesting a refill of Modafinil be faxed to [phone]. She states they are unable to receive escribe narcotic temporarily.   Last OV 10/20/2017 Last Filled 10/23/2017 Take 1.5 tablets po daily. #45 with 3 refills.   BM would you be willing to sign this script as VS is out of the office.

## 2018-04-30 NOTE — Telephone Encounter (Signed)
04/30/2018 1310  Yes I am okay to fill this prescription for Dr. Craige Cotta as he is out of the office.  Before prescribing the patient with modafinil, I have checked [place] PMP aware and the patients overdose risk score is 310. Patient has 5 providers prescribing controlled substances. Patient has used 3 pharmacies. Patient has been prescribed 45 tablets with no refills.   Printed prescription and handed to triage team.  Elisha Headland, FNP

## 2018-05-02 NOTE — Telephone Encounter (Signed)
[place]- can you confirm that this was done? I checked with Arlys John and he said he handed it off to you. Thanks

## 2018-05-02 NOTE — Telephone Encounter (Signed)
Yes it has been faxed. Nothing further is needed at this time.

## 2018-10-17 ENCOUNTER — Encounter (HOSPITAL_COMMUNITY): Payer: Self-pay

## 2018-10-17 ENCOUNTER — Emergency Department (HOSPITAL_COMMUNITY)
Admission: EM | Admit: 2018-10-17 | Discharge: 2018-10-18 | Disposition: A | Discharge status: Home / Self Care | Payer: No Typology Code available for payment source | Attending: Emergency Medicine | Admitting: Emergency Medicine

## 2018-10-17 DIAGNOSIS — R51 Headache: Secondary | ICD-10-CM | POA: Diagnosis present

## 2018-10-17 DIAGNOSIS — R110 Nausea: Secondary | ICD-10-CM | POA: Diagnosis not present

## 2018-10-17 DIAGNOSIS — G971 Other reaction to spinal and lumbar puncture: Secondary | ICD-10-CM | POA: Insufficient documentation

## 2018-10-17 DIAGNOSIS — H53149 Visual discomfort, unspecified: Secondary | ICD-10-CM | POA: Insufficient documentation

## 2018-10-17 DIAGNOSIS — Z79899 Other long term (current) drug therapy: Secondary | ICD-10-CM | POA: Insufficient documentation

## 2018-10-17 NOTE — ED Triage Notes (Signed)
Pt comes via [facility] for migraine that started at 630 with photophobia and nausea. Pt had MRI of head 2 months ago that showed lesions, possible MS, had LP done on Monday as well.

## 2018-10-18 ENCOUNTER — Other Ambulatory Visit: Payer: Self-pay

## 2018-10-18 MED ORDER — BUTALBITAL-APAP-CAFFEINE 50-325-40 MG PO TABS
1.0000 | ORAL_TABLET | Freq: Once | ORAL | Status: AC
  Administered 2018-10-18: 1 via ORAL
  Filled 2018-10-18: qty 1

## 2018-10-18 MED ORDER — DIPHENHYDRAMINE HCL 50 MG/ML IJ SOLN
25.0000 mg | Freq: Once | INTRAMUSCULAR | Status: AC
  Administered 2018-10-18: 25 mg via INTRAVENOUS
  Filled 2018-10-18: qty 1

## 2018-10-18 MED ORDER — METOCLOPRAMIDE HCL 5 MG/ML IJ SOLN
10.0000 mg | Freq: Once | INTRAMUSCULAR | Status: AC
  Administered 2018-10-18: 10 mg via INTRAVENOUS
  Filled 2018-10-18: qty 2

## 2018-10-18 MED ORDER — SODIUM CHLORIDE 0.9 % IV BOLUS (SEPSIS)
2000.0000 mL | Freq: Once | INTRAVENOUS | Status: AC
  Administered 2018-10-18: 2000 mL via INTRAVENOUS

## 2018-10-18 NOTE — Discharge Instructions (Addendum)
PLEASE CALL [phone] TO SCHEDULE A BLOOD PATCH ON Friday IF YOU ARE NOT IMPROVED  PLEASE INCREASE YOUR CAFFEINE INTAKE FOR YOUR HEADACHE

## 2018-10-18 NOTE — ED Notes (Signed)
Pt ambulated to restroom with steady gait Tolerated well 

## 2018-10-18 NOTE — ED Provider Notes (Signed)
[facility]ERG[facility]PARTMENT [facility]   CSN: [id number] Arrival date & time: 10/17/18  2231     History   Chief Complaint Chief Complaint  Patient presents with  . Migraine    HPI Susan Barron is a 43 y.o. female.     The history is provided by the patient.  Headache Pain location:  Generalized Quality:  Sharp Onset quality:  Gradual Timing:  Constant Progression:  Waxing and waning Chronicity:  New Relieved by: LYING FLAT. Exacerbated by: STANDING UP. Associated symptoms: nausea   Associated symptoms: no abdominal pain, no blurred vision, no cough, no fever, no focal weakness and no weakness   Patient with history of bipolar, IBS presents with headache.  Patient [facility].  She had a lumbar puncture done on August 17.  She did well after the procedure, but starting yesterday she began having gradual onset of headache.  The headache is worse with standing up, improved with rest.  No fevers.  No focal weakness.  When lying flat she feels well. She Reports nausea and photophobia  Past Medical History:  Diagnosis Date  . Abnormal glandular Papanicolaou smear of cervix   . Anxiety   . Bipolar disorder, unspecified (HCC)   . History of colposcopy with cervical biopsy   . 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 Irritable bowel syndrome   . Narcolepsy without cataplexy(347.00)   . Status post bunionectomy   . Thyroiditis, unspecified     Patient Active Problem List   Diagnosis Date Noted  . Major depressive disorder, single episode 05/07/2012  . Narcolepsy without cataplexy(347.00) 10/20/2010  . 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 BIPOLAR AFFECTIVE DISORDER 04/27/2006  . ANXIETY 04/27/2006  . ABNORMAL PAP SMEAR 04/27/2006    Past Surgical History:  Procedure Laterality Date  . ABDOMINAL HYSTERECTOMY    . BREAST ENHANCEMENT SURGERY  2011  . BUNIONECTOMY  1998 % 1999    x 2      OB History   No obstetric history on file.      Home Medications    Prior to Admission medications   Medication Sig Start Date End Date Taking? Authorizing Provider  docusate sodium (COLACE) 100 MG capsule Take 100 mg by mouth 2 (two) times daily.    [provider]  modafinil (PROVIGIL) 100 MG tablet Take 1.5 tablets (150 mg total) by mouth daily. 04/30/18   Coral CeoMack, Brian P, NP  sertraline (ZOLOFT) 50 MG tablet Take 1 tablet (50 mg total) by mouth daily. 10/23/17   Coralyn HellingSood, Vineet, MD    Family History No family history on file.  Social History Social History   Tobacco Use  . Smoking status: Never Smoker  . Smokeless tobacco: Never Used  Substance Use Topics  . Alcohol use: No    Comment: denied drugs/alcohol/tobacco  . Drug use: No     Allergies   Codeine phosphate   Review of Systems Review of Systems  Constitutional: Negative for fever.  Eyes: Negative for blurred vision.  Respiratory: Negative for cough.   Gastrointestinal: Positive for nausea. Negative for abdominal pain.  Neurological: Positive for headaches. Negative for focal weakness and weakness.  All other systems reviewed and are negative.    Physical Exam Updated Vital Signs BP 121/72 (BP Location: Left Arm)   Pulse (!) 58   Temp 98.4 F (36.9 C) (Oral)   Resp 16   LMP 12/25/2016  SpO2 100%   Physical Exam CONSTITUTIONAL: Well developed/well nourished lying flat HEAD: Normocephalic/atraumatic EYES: EOMI/PERRL, no nystagmus, no ptosis ENMT: Mucous membranes moist NECK: supple no meningeal signs  CV: S1/S2 noted, no murmurs/rubs/gallops noted LUNGS: Lungs are clear to auscultation bilaterally, no apparent distress ABDOMEN: soft, nontender  NEURO:Awake/alert, face symmetric, no arm or leg drift is noted Equal 5/5 strength with shoulder abduction, elbow flex/extension, wrist flex/extension in upper extremities  Equal 5/5 strength with hip flexion,knee flex/extension, foot  dorsi/plantar flexion Cranial nerves 3/4/5/6/09/05/08/11/12 tested and intact No past pointing Sensation to light touch intact in all extremities EXTREMITIES: pulses normal, full ROM SKIN: warm, color normal PSYCH: no abnormalities of mood noted, alert and oriented to situation  ED Treatments / Results  Labs (all labs ordered are listed, but only abnormal results are displayed) Labs Reviewed - No data to display  EKG None  Radiology No results found.  Procedures Procedures   Medications Ordered in ED Medications  metoCLOPramide (REGLAN) injection 10 mg (has no administration in time range)  diphenhydrAMINE (BENADRYL) injection 25 mg (has no administration in time range)  sodium chloride 0.9 % bolus 2,000 mL (has no administration in time range)     Initial Impression / Assessment and Plan / ED Course  I have reviewed the triage vital signs and the nursing notes.        7:06 AM Patient presents with post LP headache.  She is well-appearing, no acute distress.  She is afebrile. Plan will be to treat headache fluids and reglan/benadry and  Reassess.  She will also need to start caffeine  I discussed the case with radiology.  An order has been placed in case patient needs a blood patch.  She should call number [phone] on Friday, August 21 if her headache continues.  D/w dr Reather Converse at signout to reassess patient after medications/fluids.  Anticipate discharge  Final Clinical Impressions(s) / ED Diagnoses   Final diagnoses:  Post lumbar puncture headache    ED Discharge Orders    None       Ripley Fraise, MD 10/18/18 [phone]

## 2018-10-18 NOTE — ED Notes (Signed)
Pt verbalized understanding of d/c instructions and has no further questions, VSS, NAD. Pt d/c home with family driving.  

## 2021-01-06 ENCOUNTER — Encounter: Payer: Self-pay | Admitting: Gastroenterology

## 2021-01-06 ENCOUNTER — Ambulatory Visit (INDEPENDENT_AMBULATORY_CARE_PROVIDER_SITE_OTHER): Payer: No Typology Code available for payment source | Admitting: Gastroenterology

## 2021-01-06 VITALS — BP 111/73 | HR 77 | Temp 99.1°F | Ht 62.0 in | Wt 143.2 lb

## 2021-01-06 DIAGNOSIS — K219 Gastro-esophageal reflux disease without esophagitis: Secondary | ICD-10-CM

## 2021-01-06 NOTE — Progress Notes (Signed)
Arlyss Repress, MD [address]  [address]  [place], [place] [address]  Main: [phone]  Fax: [phone]    Gastroenterology Consultation  Referring Provider:     [facility], [facility] Primary Care Physician:  Prentiss Bells, MD Primary Gastroenterologist: Renne Musca, GI department Reason for Consultation:   EGD-Bravo pH capsule placement        HPI:   Susan Barron is a 45 y.o. female referred by Dr. Prentiss Bells, MD  for consultation & management of chronic symptoms of nausea, reflux.  Patient has history of refractory GERD, has been closely followed by GI at [place], [place].  Patient reports that she has tried several different PPIs and has underwent several investigations.  She is referred to outside [place] GI for Bravo pH capsule placement.  Patient reports that she cannot tolerate 24-hour pH impedance study.  She is currently not on any PPI.  She does have history of IBS with irregular bowel movements, managed on a stool softener.  I informed the patient that we do not have Bravo pH capsule and she has to be referred to [facility].  She was upset that the medical records from the [place] department has not been faxed to [place].  I see a copy of CBC, CMP which revealed normal hemoglobin, mildly elevated AST only.  Patient states she also underwent gastric emptying study which was normal.  She was also tested for celiac disease which was negative  NSAIDs: None  Antiplts/Anticoagulants/Anti thrombotics: None  GI Procedures: She reports undergoing upper endoscopy and colonoscopy within last 2 years  Past Medical History:  Diagnosis Date   Abnormal glandular Papanicolaou smear of cervix    Anxiety    Bipolar disorder, unspecified (HCC)    History of colposcopy with cervical biopsy    Human papillomavirus in conditions classified elsewhere and of unspecified site    Irritable bowel syndrome    Narcolepsy without cataplexy(347.00)    Status post  bunionectomy    Thyroiditis, unspecified     Past Surgical History:  Procedure Laterality Date   ABDOMINAL HYSTERECTOMY     BREAST ENHANCEMENT SURGERY  2011   BUNIONECTOMY  1998 % 1999   x 2    Current Outpatient Medications:    valACYclovir (VALTREX) 1000 MG tablet, TAKE TWO TABLETS BY MOUTH DAILY - SUPPRESSIVE DOSING FOR SYMPTOMS REFRACTORY TO 1000MG  DAILY DOSE, Disp: , Rfl:    docusate sodium (COLACE) 100 MG capsule, Take 100 mg by mouth 2 (two) times daily. (Patient not taking: Reported on 01/06/2021), Disp: , Rfl:    modafinil (PROVIGIL) 100 MG tablet, Take 1.5 tablets (150 mg total) by mouth daily. (Patient not taking: Reported on 01/06/2021), Disp: 45 tablet, Rfl: 0   sertraline (ZOLOFT) 50 MG tablet, Take 1 tablet (50 mg total) by mouth daily. (Patient not taking: Reported on 01/06/2021), Disp: 30 tablet, Rfl: 3  History reviewed. No pertinent family history.   Social History   Tobacco Use   Smoking status: Never   Smokeless tobacco: Never  Vaping Use   Vaping Use: Never used  Substance Use Topics   Alcohol use: No    Comment: denied drugs/alcohol/tobacco   Drug use: No    Allergies as of 01/06/2021 - Review Complete 01/06/2021  Allergen Reaction Noted   Codeine Itching, Other (See Comments), Rash, and Hives 07/06/2005   Amitriptyline Other (See Comments) 04/11/2017   Codeine phosphate  05/31/2006   Gabapentin Other (See Comments) 12/06/2018   Propranolol Other (See  Comments) 01/17/2019   Topiramate Other (See Comments) 08/05/2020    Review of Systems:    All systems reviewed and negative except where noted in HPI.   Physical Exam:  BP 111/73 (BP Location: Left Arm, Patient Position: Sitting, Cuff Size: Normal)   Pulse 77   Temp 99.1 F (37.3 C) (Oral)   Ht 5\' 2"  (1.575 m)   Wt 143 lb 3.2 oz (65 kg)   LMP 12/25/2016   BMI 26.19 kg/m  Patient's last menstrual period was 12/25/2016.  General:   Alert,  Well-developed, well-nourished, pleasant and  cooperative in NAD Head:  Normocephalic and atraumatic. Eyes:  Sclera clear, no icterus.   Conjunctiva pink. Ears:  Normal auditory acuity. Nose:  No deformity, discharge, or lesions. Mouth:  No deformity or lesions,oropharynx pink & moist. Neck:  Supple; no masses or thyromegaly. Lungs:  Respirations even and unlabored.  Clear throughout to auscultation.   No wheezes, crackles, or rhonchi. No acute distress. Heart:  Regular rate and rhythm; no murmurs, clicks, rubs, or gallops. Abdomen:  Normal bowel sounds. Soft, non-tender and non-distended without masses, hepatosplenomegaly or hernias noted.  No guarding or rebound tenderness.   Rectal: Not performed Msk:  Symmetrical without gross deformities. Good, equal movement & strength bilaterally. Pulses:  Normal pulses noted. Extremities:  No clubbing or edema.  No cyanosis. Neurologic:  Alert and oriented x3;  grossly normal neurologically. Skin:  Intact without significant lesions or rashes. No jaundice. Psych:  Alert and cooperative. Normal mood and affect.  Imaging Studies: None  Assessment and Plan:   Susan Barron is a 44 y.o. female with history of refractory GERD is referred to 55 for Bravo pH study.  I informed the patient that we do not have Bravo pH study and she has to be referred to [facility].  She said she will follow-up with her primary GI for a new referral.   Follow up as needed   [facility], MD

## 2021-03-01 ENCOUNTER — Emergency Department (INDEPENDENT_AMBULATORY_CARE_PROVIDER_SITE_OTHER)
Admission: EM | Admit: 2021-03-01 | Discharge: 2021-03-01 | Disposition: A | Discharge status: Home / Self Care | Payer: No Typology Code available for payment source | Source: Home / Self Care | Attending: Family Medicine | Admitting: Family Medicine

## 2021-03-01 ENCOUNTER — Other Ambulatory Visit: Payer: Self-pay

## 2021-03-01 DIAGNOSIS — H6592 Unspecified nonsuppurative otitis media, left ear: Secondary | ICD-10-CM | POA: Diagnosis not present

## 2021-03-01 MED ORDER — AMOXICILLIN-POT CLAVULANATE 875-125 MG PO TABS: 1.0000 | ORAL_TABLET | Freq: Two times a day (BID) | ORAL | 0 refills | Status: DC

## 2021-03-01 NOTE — ED Provider Notes (Signed)
[facility]    CSN: [id number] Arrival date & time: 03/01/21  0841      History   Chief Complaint Chief Complaint  Patient presents with   Sore Throat   Otalgia    Left side    HPI Susan Barron is a 46 y.o. female.   HPI  Patient states that she has been having recurring COVID infections.  She states she has 3 COVID infections last year.  This is in spite of having her initial series and a booster.  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  She has type of blood.  She states that she had COVID most recently on 02/20/2021.  She is here because for the last 2 days she has had a real thick drainage, pressure and pain in her left ear, sore throat on the left side.  She is worried she may need an antibiotic.  Past Medical History:  Diagnosis Date   Abnormal glandular Papanicolaou smear of cervix    Anxiety    Bipolar disorder, unspecified (HCC)    History of colposcopy with cervical biopsy    Human papillomavirus in conditions classified elsewhere and of unspecified site    Irritable bowel syndrome    Narcolepsy without cataplexy(347.00)    Status post bunionectomy    Thyroiditis, unspecified     Patient Active Problem List   Diagnosis Date Noted   Major depressive disorder, single episode 05/07/2012   Narcolepsy without cataplexy(347.00) 10/20/2010   HPV 04/27/2006   BIPOLAR AFFECTIVE DISORDER 04/27/2006   ANXIETY 04/27/2006   ABNORMAL PAP SMEAR 04/27/2006    Past Surgical History:  Procedure Laterality Date   ABDOMINAL HYSTERECTOMY     BREAST ENHANCEMENT SURGERY  2011   BUNIONECTOMY  1998 % 1999   x 2     OB History   No obstetric history on file.      Home Medications    Prior to Admission medications   Medication Sig Start Date End Date Taking? Authorizing Provider  amoxicillin-clavulanate (AUGMENTIN) 875-125 MG tablet Take 1 tablet by mouth every 12 (twelve) hours. 03/01/21  Yes Eustace Moore, MD    Family  History History reviewed. No pertinent family history.  Social History Social History   Tobacco Use   Smoking status: Never   Smokeless tobacco: Never  Vaping Use   Vaping Use: Never used  Substance Use Topics   Alcohol use: No    Comment: denied drugs/alcohol/tobacco   Drug use: No     Allergies   Codeine, Amitriptyline, Codeine phosphate, Gabapentin, Propranolol, and Topiramate   Review of Systems Review of Systems See HPI  Physical Exam Triage Vital Signs ED Triage Vitals  Enc Vitals Group     BP 03/01/21 0856 105/72     Pulse Rate 03/01/21 0856 78     Resp 03/01/21 0856 14     Temp 03/01/21 0856 98.7 F (37.1 C)     Temp Source 03/01/21 0856 Oral     SpO2 03/01/21 0856 97 %     Weight --      Height --      Head Circumference --      Peak Flow --      Pain Score 03/01/21 0858 2     Pain Loc --      Pain Edu? --      Excl. in GC? --    No data found.  Updated Vital Signs BP 105/72 (  BP Location: Left Arm)    Pulse 78    Temp 98.7 F (37.1 C) (Oral)    Resp 14    LMP 12/25/2016    SpO2 97%       Physical Exam Constitutional:      General: She is not in acute distress.    Appearance: She is well-developed.  HENT:     Head: Normocephalic and atraumatic.     Right Ear: Tympanic membrane, ear canal and external ear normal.     Left Ear: Ear canal normal.     Ears:     Comments: Left TM is dull and injected    Nose: No congestion or rhinorrhea.     Mouth/Throat:     Mouth: Mucous membranes are moist.     Pharynx: No posterior oropharyngeal erythema.  Eyes:     Conjunctiva/sclera: Conjunctivae normal.     Pupils: Pupils are equal, round, and reactive to light.  Cardiovascular:     Rate and Rhythm: Normal rate and regular rhythm.     Heart sounds: Normal heart sounds.  Pulmonary:     Effort: Pulmonary effort is normal. No respiratory distress.     Breath sounds: Normal breath sounds.  Abdominal:     General: There is no distension.      Palpations: Abdomen is soft.  Musculoskeletal:        General: Normal range of motion.     Cervical back: Normal range of motion.  Lymphadenopathy:     Cervical: No cervical adenopathy.  Skin:    General: Skin is warm and dry.  Neurological:     Mental Status: She is alert.  Psychiatric:        Mood and Affect: Mood normal.        Behavior: Behavior normal.     UC Treatments / Results  Labs (all labs ordered are listed, but only abnormal results are displayed) Labs Reviewed - No data to display  EKG   Radiology No results found.  Procedures Procedures (including critical care time)  Medications Ordered in UC Medications - No data to display  Initial Impression / Assessment and Plan / UC Course  I have reviewed the triage vital signs and the nursing notes.  Pertinent labs & imaging results that were available during my care of the patient were reviewed by me and considered in my medical decision making (see chart for details).     Discussed gust rebuilding immunity with exercise, diet, probiotics, and infection control measures to use while working.  Consult with primary care if fails to improve. Final Clinical Impressions(s) / UC Diagnoses   Final diagnoses:  Left otitis media with effusion     Discharge Instructions      Continue drinking lots of fluids Continue with your probiotics and vitamin supplementation Take the Augmentin 2 times a day for 7 days Add Flonase 2 times a day for 3 days then once a day until symptoms have improved See your primary care doctor if not improving by next week     ED Prescriptions     Medication Sig Dispense Auth. Provider   amoxicillin-clavulanate (AUGMENTIN) 875-125 MG tablet Take 1 tablet by mouth every 12 (twelve) hours. 14 tablet Eustace Moore, MD      PDMP not reviewed this encounter.   Eustace Moore, MD 03/01/21 [phone]

## 2021-03-01 NOTE — Discharge Instructions (Signed)
Continue drinking lots of fluids Continue with your probiotics and vitamin supplementation Take the Augmentin 2 times a day for 7 days Add Flonase 2 times a day for 3 days then once a day until symptoms have improved See your primary care doctor if not improving by next week

## 2021-03-01 NOTE — ED Triage Notes (Signed)
Pt presents with sore throat and left ear pain x2 days. Pt states she was diagnosed with covid on 12/24. Pt endorses thick solidified mucous

## 2021-04-13 ENCOUNTER — Encounter: Payer: Self-pay | Admitting: Gastroenterology

## 2021-05-04 ENCOUNTER — Ambulatory Visit (INDEPENDENT_AMBULATORY_CARE_PROVIDER_SITE_OTHER): Payer: No Typology Code available for payment source | Admitting: Gastroenterology

## 2021-05-04 ENCOUNTER — Encounter: Payer: Self-pay | Admitting: Gastroenterology

## 2021-05-04 VITALS — BP 100/80 | HR 68 | Ht 61.5 in | Wt 150.2 lb

## 2021-05-04 DIAGNOSIS — K219 Gastro-esophageal reflux disease without esophagitis: Secondary | ICD-10-CM

## 2021-05-04 NOTE — Patient Instructions (Addendum)
If you are age 48 or younger, your body mass index should be between 19-25. Your Body mass index is 27.93 kg/m?Marland Kitchen If this is out of the aformentioned range listed, please consider follow up with your Primary Care Provider.  ?________________________________________________________ ? ?The [place] [facility] providers would like to encourage you to use [facility] to communicate with providers for non-urgent requests or questions.  Due to long hold times on the telephone, sending your provider a message by [facility] may be a faster and more efficient way to get a response.  Please allow 48 business hours for a response.  Please remember that this is for non-urgent requests.  ?_______________________________________________________ ? ?You have been scheduled for an endoscopy. Please follow written instructions given to you at your visit today. ?If you use inhalers (even only as needed), please bring them with you on the day of your procedure. ? ?Due to recent changes in healthcare laws, you may see the results of your imaging and laboratory studies on MyChart before your provider has had a chance to review them.  We understand that in some cases there may be results that are confusing or concerning to you. Not all laboratory results come back in the same time frame and the provider may be waiting for multiple results in order to interpret others.  Please give [place] 48 hours in order for your provider to thoroughly review all the results before contacting the office for clarification of your results.  ? ?Thank you for entrusting me with your care and choosing [facility]. ? ?Dr Tomasa Rand ? ?

## 2021-05-04 NOTE — Progress Notes (Signed)
? ?HPI : Susan Barron is a pleasant 46 year old female referred from the [facility] for Bravo probe placement.  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  The patient's most bothersome symptom is constant throat clearing and throat irritation.  She states that this has been causing significant impairment of her voice.  In addition to the throat irritation, she also has symptoms of heartburn and acid regurgitation.  She has symptoms most days of the week, both day and night.  She is regularly awakened from sleep by the symptoms.  She has tried taking acid suppressing medications (pantoprazole, famotidine, and others which she does not recall) but does not feel that these medications made much of a difference in her symptom control.  She has made significant changes in her diet (started a "low acid diet") which also have not seem to help very much.  Her voice has improved, but her throat clearing/irritation and other GERD symptoms have not.  She gained 10 pounds in January, and has not been able to lose any of this weight despite significant dietary restrictions (15 cal/day). ?She has been evaluated by speech therapy and was not felt to have any component of anxiety to her throat clearing or irritation. ?She also has IBS and has continued problems with excessive bloating and abdominal discomfort. ?She underwent esophageal pH/impedance and manometry studies.  She completed the manometry portion, but the impedance test was incomplete because the patient was not able to tolerate the full 24 hours of having the catheter in place.  She states that the catheter is moved after about 15 hours and was notable for 60 reflux events. ?She has had multiple upper endoscopies, and states that hiatal hernia was reported on some, but not all of them. ?I do not have any access to any of these previous  studies.  We received a faxed referral from the [place], but it did not have any of this pertinent information. ?She has also undergone a colonoscopy previously. ? ?Past Medical History:  ?Diagnosis Date  ? Abnormal glandular Papanicolaou smear of cervix   ? Anxiety   ? Bipolar disorder, unspecified (HCC)   ? History of colposcopy with cervical biopsy   ? Human papillomavirus in conditions classified elsewhere and of unspecified site   ? Irritable bowel syndrome   ? Narcolepsy without cataplexy(347.00)   ? Status post bunionectomy   ? Thyroiditis, unspecified   ? ? ? ?Past Surgical History:  ?Procedure Laterality Date  ? ABDOMINAL HYSTERECTOMY    ? BREAST ENHANCEMENT SURGERY  2011  ? BUNIONECTOMY  1998 % 1999  ? x 2   ? ?History reviewed. No pertinent family history. ?Social History  ? ?Tobacco Use  ? Smoking status: Never  ? Smokeless tobacco: Never  ?Vaping Use  ? Vaping Use: Never used  ?Substance Use Topics  ? Alcohol use: No  ?  Comment: denied drugs/alcohol/tobacco  ? Drug use: No  ? ?Current Outpatient Medications  ?Medication Sig Dispense Refill  ? amoxicillin-clavulanate (AUGMENTIN) 875-125 MG tablet Take 1 tablet by mouth every 12 (twelve) hours. 14 tablet 0  ? ?No current facility-administered medications for this visit.  ? ?Allergies  ?Allergen Reactions  ? Codeine Itching, Other (See Comments), Rash and Hives  ?  REACTION: urticaria (hives) ?REACTION: urticaria (hives) ?REACTION: urticaria (hives) ?  ? Amitriptyline Other (See Comments)  ?  drowsy  ?  Codeine Phosphate   ?  REACTION: urticaria (hives)  ? Gabapentin Other (See Comments)  ?  Other reaction(s): Ataxia  ? Propranolol Other (See Comments)  ? Topiramate Other (See Comments)  ? ? ? ?Review of Systems: ?All systems reviewed and negative except where noted in HPI.  ? ? ?No results found. ? ?Physical Exam: ?Ht 5' 1.5" (1.562 m)   Wt 150 lb 4 oz (68.2 kg)   LMP 12/25/2016   BMI 27.93 kg/m?  ?Constitutional: Pleasant,well-developed, Caucasian female  in no acute distress, but clearly frustrated with her symptoms. ?HEENT: Normocephalic and atraumatic. Conjunctivae are normal. No scleral icterus. ?Cardiovascular: Normal rate, regular rhythm.  ?Pulmonary/chest: Effort normal and breath sounds normal. No wheezing, rales or rhonchi. ?Abdominal: Soft, nondistended, mild multifocal tenderness to palpation without rigidity or guarding. Bowel sounds active throughout. There are no masses palpable. No hepatomegaly. ?Extremities: no edema ?Neurological: Alert and oriented to person place and time. ?Skin: Skin is warm and dry. No rashes noted. ?Psychiatric: Normal mood and affect. Behavior is normal. ? ?CBC ?   ?Component Value Date/Time  ? WBC 5.0 04/04/2018 0705  ? RBC 4.07 04/04/2018 0705  ? HGB 13.0 04/04/2018 0705  ? HCT 39.1 04/04/2018 0705  ? PLT 242 04/04/2018 0705  ? MCV 96.1 04/04/2018 0705  ? MCH 31.9 04/04/2018 0705  ? MCHC 33.2 04/04/2018 0705  ? RDW 11.3 (L) 04/04/2018 0705  ? LYMPHSABS 2.9 12/31/2016 1925  ? MONOABS 0.3 12/31/2016 1925  ? EOSABS 0.1 12/31/2016 1925  ? BASOSABS 0.0 12/31/2016 1925  ? ? ?CMP  ?   ?Component Value Date/Time  ? NA 136 04/04/2018 0705  ? K 4.0 04/04/2018 0705  ? CL 106 04/04/2018 0705  ? CO2 26 04/04/2018 0705  ? GLUCOSE 64 (L) 04/04/2018 0705  ? BUN 11 04/04/2018 0705  ? CREATININE 0.85 04/04/2018 0705  ? CALCIUM 9.2 04/04/2018 0705  ? PROT 7.8 12/23/2009 0000  ? ALBUMIN 4.4 12/23/2009 0000  ? AST 14 12/23/2009 0000  ? ALT 28 12/23/2009 0000  ? ALKPHOS 49 12/23/2009 0000  ? BILITOT 1.1 12/23/2009 0000  ? GFRNONAA >60 04/04/2018 0705  ? GFRAA >60 04/04/2018 0705  ? ? ? ?ASSESSMENT AND PLAN: ?46 year old female with chronic typical and atypical GERD symptoms not responsive to PPI, with reportedly unremarkable EGDs, incomplete pH/impedance test notable for numerous reflux events but not tolerated for full 24 hours (details not available), patient referred for Bravo probe placement.  We will schedule patient for EGD with Bravo probe  placement.  Recommend patient to remain off PPI until after completion of study.  Defer ongoing management of IBS symptoms to patient's primary gastroenterologist. ? ?GERD symptoms despite PPI ?- EGD with Bravo probe placement ? ?The details, risks (including bleeding, perforation, infection, missed lesions, medication reactions and possible hospitalization or surgery if complications occur), benefits, and alternatives to EGD with Bravo probe placement with possible were discussed with the patient and she consents to proceed.  ? ?Siddh Vandeventer E. Tomasa Rand, MD ?[facility] ? ?CC:  Prentiss Bells,* ? ?

## 2021-05-21 ENCOUNTER — Encounter: Payer: Self-pay | Admitting: Gastroenterology

## 2021-05-21 ENCOUNTER — Ambulatory Visit (AMBULATORY_SURGERY_CENTER): Payer: No Typology Code available for payment source | Admitting: Gastroenterology

## 2021-05-21 VITALS — BP 96/70 | HR 67 | Temp 98.4°F | Resp 13 | Ht 61.0 in | Wt 150.0 lb

## 2021-05-21 DIAGNOSIS — R12 Heartburn: Secondary | ICD-10-CM | POA: Diagnosis not present

## 2021-05-21 DIAGNOSIS — K219 Gastro-esophageal reflux disease without esophagitis: Secondary | ICD-10-CM

## 2021-05-21 MED ORDER — SODIUM CHLORIDE 0.9 % IV SOLN: 500.0000 mL | Freq: Once | INTRAVENOUS | Status: DC

## 2021-05-21 NOTE — Progress Notes (Signed)
Called to room to assist during endoscopic procedure.  Patient ID and intended procedure confirmed with present staff. Received instructions for my participation in the procedure from the performing physician. ? ? ?Capsule expiration date- 03/30/2022 ? ?Capsule ID number [id number] ? ?LES measurement: 37 ?(capsule placed 6 cm above LES) 31 ? ?Time of implant: 10:29am  ? ?[employer] Rep Ryan present/instructing during placement. ?

## 2021-05-21 NOTE — Op Note (Signed)
[facility] ?Patient Name: Susan Barron ?Procedure Date: 05/21/2021 10:02 AM ?MRN: [id number] ?Endoscopist: Verlene Glantz E. Tomasa Randunningham , MD ?Age: 46 ?Referring MD:  ?Date of Birth: Jul 07, 1975 ?Gender: Female ?Account #: [id number] ?Procedure:                Upper GI endoscopy ?Indications:              Esophageal reflux symptoms that persist despite  ?                          appropriate therapy ?Medicines:                Monitored Anesthesia Care ?Procedure:                Pre-Anesthesia Assessment: ?                          - Prior to the procedure, a History and Physical  ?                          was performed, and patient medications and  ?                          allergies were reviewed. The patient's tolerance of  ?                          previous anesthesia was also reviewed. The risks  ?                          and benefits of the procedure and the sedation  ?                          options and risks were discussed with the patient.  ?                          All questions were answered, and informed consent  ?                          was obtained. Prior Anticoagulants: The patient has  ?                          taken no previous anticoagulant or antiplatelet  ?                          agents. ASA Grade Assessment: II - A patient with  ?                          mild systemic disease. After reviewing the risks  ?                          and benefits, the patient was deemed in  ?                          satisfactory condition to undergo the procedure. ?  After obtaining informed consent, the endoscope was  ?                          passed under direct vision. Throughout the  ?                          procedure, the patient's blood pressure, pulse, and  ?                          oxygen saturations were monitored continuously. The  ?                          Endoscope was introduced through the mouth, and  ?                          advanced to the third part of  duodenum. The upper  ?                          GI endoscopy was accomplished without difficulty.  ?                          The patient tolerated the procedure well. ?Scope In: ?Scope Out: ?Findings:                 The examined portions of the nasopharynx,  ?                          oropharynx and larynx were normal. ?                          The examined esophagus was normal. ?                          The gastroesophageal flap valve was visualized  ?                          endoscopically and classified as Hill Grade IV (no  ?                          fold, wide open lumen, hiatal hernia present). ?                          The entire examined stomach was normal. ?                          The examined duodenum was normal. ?                          The BRAVO capsule with delivery system was  ?                          introduced through the mouth and advanced into the  ?                          esophagus, such that the BRAVO pH capsule  was  ?                          positioned 31 cm from the incisors, which was 6 cm  ?                          proximal to the GE junction. The BRAVO pH capsule  ?                          was then deployed and attached to the esophageal  ?                          mucosa. The delivery system was then withdrawn.  ?                          Endoscopy was utilized for placement of the probe  ?                          only. ?Complications:            No immediate complications. ?Estimated Blood Loss:     Estimated blood loss: none. ?Impression:               - The examined portions of the nasopharynx,  ?                          oropharynx and larynx were normal. ?                          - Normal esophagus. ?                          - Gastroesophageal flap valve classified as Hill  ?                          Grade IV (no fold, wide open lumen, hiatal hernia  ?                          present). ?                          - Normal stomach. ?                          - Normal  examined duodenum. ?                          - The BRAVO pH capsule was deployed. ?                          - No specimens collected. ?Recommendation:           - Patient has a contact number available for  ?                          emergencies. The signs and symptoms of potential  ?  delayed complications were discussed with the  ?                          patient. Return to normal activities tomorrow.  ?                          Written discharge instructions were provided to the  ?                          patient. ?                          - Resume previous diet. ?                          - Continue present medications. ?                          - Return Bravo probe capsule in 48 hours as  ?                          discussed ?Josimar Corning E. Tomasa Rand, MD ?05/21/2021 10:40:51 AM ?This report has been signed electronically. ?

## 2021-05-21 NOTE — Progress Notes (Signed)
History and Physical Interval Note: ? ?05/21/2021 ?10:12 AM ? ?Susan Barron  has presented today for endoscopic procedure(s), with the diagnosis of  ?Encounter Diagnosis  ?Name Primary?  ? Gastroesophageal reflux disease without esophagitis Yes  ?Marland Kitchen  The various methods of evaluation and treatment have been discussed with the patient and/or family. After consideration of risks, benefits and other options for treatment, the patient has consented to  the endoscopic procedure(s). ? ? The patient's history has been reviewed, patient examined, no change in status, stable for endoscopic procedure(s).  I have reviewed the patient's chart and labs.  Questions were answered to the patient's satisfaction.   ? ? ?Mehr Depaoli E. Tomasa Rand, MD ?[facility] ? ?

## 2021-05-21 NOTE — Progress Notes (Signed)
Pt non-responsive, VVS, Report to RN  °

## 2021-05-21 NOTE — Progress Notes (Signed)
Pt's states no medical or surgical changes since previsit or office visit. 

## 2021-05-21 NOTE — Patient Instructions (Signed)
HANDOUTS GIVEN FOR GERD, HIATAL HERNIA, AND DIARY FOR BRAVO DEVICE. ? ?YOU HAD AN ENDOSCOPIC PROCEDURE TODAY AT [place]:   Refer to the procedure report that was given to you for any specific questions about what was found during the examination.  If the procedure report does not answer your questions, please call your gastroenterologist to clarify.  If you requested that your care partner not be given the details of your procedure findings, then the procedure report has been included in a sealed envelope for you to review at your convenience later. ? ?YOU SHOULD EXPECT: Some feelings of bloating in the abdomen. Passage of more gas than usual.  Walking can help get rid of the air that was put into your GI tract during the procedure and reduce the bloating. If you had a lower endoscopy (such as a colonoscopy or flexible sigmoidoscopy) you may notice spotting of blood in your stool or on the toilet paper. If you underwent a bowel prep for your procedure, you may not have a normal bowel movement for a few days. ? ?Please Note:  You might notice some irritation and congestion in your nose or some drainage.  This is from the oxygen used during your procedure.  There is no need for concern and it should clear up in a day or so. ? ?SYMPTOMS TO REPORT IMMEDIATELY: ? ?Following upper endoscopy (EGD) ? Vomiting of blood or coffee ground material ? New chest pain or pain under the shoulder blades ? Painful or persistently difficult swallowing ? New shortness of breath ? Fever of 100?F or higher ? Black, tarry-looking stools ? ?For urgent or emergent issues, a gastroenterologist can be reached at any hour by calling [phone]. ?Do not use MyChart messaging for urgent concerns.  ? ?Post-op Bravo pH instructions ?Once you get home: ? ?Eat normally and go about your daily routine/activities ?Limit drinking fluids or eating between meals ?Do not chew gum or eat hard candy ?DO NOT take any antacid or  anti-reflux medications during the 48-hour monitoring time, unless instructed by your physician ? ?Recording events: ?Events to be recorded are:  Record using event buttons on recorder and write on paper diary form ?Every time you eat or drink something (other than water) ?2.   Periods of lying down/reclining ?3.  Symptoms:  may include heartburn, regurgitation, chest pain, cough or specify if other. ? ?A paper diary is also provided to record the times of your reflux symptoms and times for meals and when you lie down. ? ?The recorder needs to remain within 3 feet (arms length) of you during the testing period (48 hours). ?If you should forget and move outside of a 3-foot radius of the receiver you may hear beeping and you will see a ?C1? error in the display window on the top of the receiver.  Please pick up the receiver and hold close to you to re-establish the connection and the error message disappears. ? ?You may take a bath/shower during the testing period, but the recorder must not get wet and must remain within 3 feet of you. Please leave the receiver outside of the shower or tub while bathing. The monitoring period will be for 48 hours after placement of the capsule. ? ?At the end of the 48 hours, you will return the recorder, and your diary, to our [facility] front desk.  A nurse will meet you to collect the device and answer any questions you may have.  The device should turn off once the 48 hours is complete.  ? ?What to expect after placement of the capsule: ? ?Some patients experience a vague sensation that something is in their esophagus or that they ?feel? the capsule when they swallow food.  Should you experience this, chewing food carefully or drinking liquids may minimize this sensation.  ? ?After the test is complete, the disposable capsule will fall off the wall of your esophagus within 5-10 days and pass naturally with your bowel movement through the digestive tract. ? ?Once the  recorder is returned, your provider will review and interpret your recordings and contact you to discuss your results.  This may take up to two weeks.  ? ?DO NOT have an MRI for 30 days after your procedure to ensure the capsule is no longer inside your body ? ?It is imperative that you return the recorder on: ? ? Monday MARCH 27th by 3:00pm.  Your information must be downloaded at this time to obtain your results.     ? ? ?DIET:  We do recommend a small meal at first, but then you may proceed to your regular diet.  Drink plenty of fluids but you should avoid alcoholic beverages for 24 hours. ? ?ACTIVITY:  You should plan to take it easy for the rest of today and you should NOT DRIVE or use heavy machinery until tomorrow (because of the sedation medicines used during the test).   ? ?FOLLOW UP: ?Our staff will call the number listed on your records 48-72 hours following your procedure to check on you and address any questions or concerns that you may have regarding the information given to you following your procedure. If we do not reach you, we will leave a message.  We will attempt to reach you two times.  During this call, we will ask if you have developed any symptoms of COVID 19. If you develop any symptoms (ie: fever, flu-like symptoms, shortness of breath, cough etc.) before then, please call [phone].  If you test positive for Covid 19 in the 2 weeks post procedure, please call and report this information to [place].   ? ?If any biopsies were taken you will be contacted by phone or by letter within the next 1-3 weeks.  Please call us at [phone] if you have not heard about the biopsies in 3 weeks.  ? ? ?SIGNATURES/CONFIDENTIALITY: ?You and/or your care partner have signed paperwork which will be entered into your electronic medical record.  These signatures attest to the fact that that the information above on your After Visit Summary has been reviewed and is understood.  Full responsibility of the  confidentiality of this discharge information lies with you and/or your care-partner.  ?

## 2021-05-25 ENCOUNTER — Telehealth: Payer: Self-pay | Admitting: *Deleted

## 2021-05-25 NOTE — Telephone Encounter (Signed)
?  Follow up Call- ? ? ?  05/21/2021  ?  9:35 AM  ?Call back number  ?Post procedure Call Back phone  # [phone]  ?Permission to leave phone message Yes  ?  ? ?Patient questions: ? ?Do you have a fever, pain , or abdominal swelling? No. ?Pain Score  0 * ? ?Have you tolerated food without any problems? Yes.   ? ?Have you been able to return to your normal activities? Yes.   ? ?Do you have any questions about your discharge instructions: ?Diet   No. ?Medications  No. ?Follow up visit  No. ? ?Do you have questions or concerns about your Care? No. ? ?Actions: ?* If pain score is 4 or above: ?No action needed, pain <4. ? ? ?
